# Patient Record
Sex: Male | Born: 1935 | Race: White | Hispanic: No | Marital: Married | Smoking: Former smoker
Health system: Southern US, Community
[De-identification: ages and names within clinical notes are randomized; demographics above are authoritative.]

## PROBLEM LIST (undated history)

## (undated) DIAGNOSIS — E7800 Pure hypercholesterolemia, unspecified: Secondary | ICD-10-CM

## (undated) DIAGNOSIS — G4733 Obstructive sleep apnea (adult) (pediatric): Secondary | ICD-10-CM

## (undated) DIAGNOSIS — N200 Calculus of kidney: Secondary | ICD-10-CM

## (undated) DIAGNOSIS — K219 Gastro-esophageal reflux disease without esophagitis: Secondary | ICD-10-CM

## (undated) DIAGNOSIS — C3490 Malignant neoplasm of unspecified part of unspecified bronchus or lung: Secondary | ICD-10-CM

## (undated) DIAGNOSIS — M1990 Unspecified osteoarthritis, unspecified site: Secondary | ICD-10-CM

## (undated) DIAGNOSIS — I10 Essential (primary) hypertension: Secondary | ICD-10-CM

## (undated) DIAGNOSIS — C439 Malignant melanoma of skin, unspecified: Secondary | ICD-10-CM

---
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
## 2001-04-13 ENCOUNTER — Encounter: Payer: Self-pay | Admitting: Pulmonary Disease

## 2001-04-13 ENCOUNTER — Ambulatory Visit (HOSPITAL_COMMUNITY): Admission: RE | Admit: 2001-04-13 | Discharge: 2001-04-13 | Payer: Self-pay | Admitting: Pulmonary Disease

## 2001-04-15 ENCOUNTER — Encounter (INDEPENDENT_AMBULATORY_CARE_PROVIDER_SITE_OTHER): Payer: Self-pay | Admitting: Specialist

## 2001-04-15 ENCOUNTER — Ambulatory Visit: Admission: RE | Admit: 2001-04-15 | Discharge: 2001-04-15 | Payer: Self-pay | Admitting: Pulmonary Disease

## 2001-05-14 ENCOUNTER — Encounter (INDEPENDENT_AMBULATORY_CARE_PROVIDER_SITE_OTHER): Payer: Self-pay | Admitting: *Deleted

## 2001-05-14 ENCOUNTER — Inpatient Hospital Stay (HOSPITAL_COMMUNITY): Admission: RE | Admit: 2001-05-14 | Discharge: 2001-05-19 | Payer: Self-pay | Admitting: Thoracic Surgery

## 2001-05-14 ENCOUNTER — Encounter: Payer: Self-pay | Admitting: Thoracic Surgery

## 2001-05-15 ENCOUNTER — Encounter: Payer: Self-pay | Admitting: Thoracic Surgery

## 2001-05-16 ENCOUNTER — Encounter: Payer: Self-pay | Admitting: Thoracic Surgery

## 2001-05-17 ENCOUNTER — Encounter: Payer: Self-pay | Admitting: Thoracic Surgery

## 2001-05-18 ENCOUNTER — Encounter: Payer: Self-pay | Admitting: Thoracic Surgery

## 2001-06-07 ENCOUNTER — Encounter: Payer: Self-pay | Admitting: Thoracic Surgery

## 2001-06-07 ENCOUNTER — Encounter: Admission: RE | Admit: 2001-06-07 | Discharge: 2001-06-07 | Payer: Self-pay | Admitting: Thoracic Surgery

## 2001-07-05 ENCOUNTER — Encounter: Admission: RE | Admit: 2001-07-05 | Discharge: 2001-07-05 | Payer: Self-pay | Admitting: Thoracic Surgery

## 2001-07-05 ENCOUNTER — Encounter: Payer: Self-pay | Admitting: Thoracic Surgery

## 2001-09-04 ENCOUNTER — Encounter: Payer: Self-pay | Admitting: Thoracic Surgery

## 2001-09-04 ENCOUNTER — Encounter: Admission: RE | Admit: 2001-09-04 | Discharge: 2001-09-04 | Payer: Self-pay | Admitting: Thoracic Surgery

## 2001-09-12 ENCOUNTER — Encounter: Payer: Self-pay | Admitting: Thoracic Surgery

## 2001-09-12 ENCOUNTER — Encounter: Admission: RE | Admit: 2001-09-12 | Discharge: 2001-09-12 | Payer: Self-pay | Admitting: Thoracic Surgery

## 2001-09-18 ENCOUNTER — Encounter (INDEPENDENT_AMBULATORY_CARE_PROVIDER_SITE_OTHER): Payer: Self-pay | Admitting: *Deleted

## 2001-09-18 ENCOUNTER — Encounter: Payer: Self-pay | Admitting: Thoracic Surgery

## 2001-09-18 ENCOUNTER — Ambulatory Visit (HOSPITAL_COMMUNITY): Admission: RE | Admit: 2001-09-18 | Discharge: 2001-09-18 | Payer: Self-pay | Admitting: Thoracic Surgery

## 2001-10-09 ENCOUNTER — Encounter: Admission: RE | Admit: 2001-10-09 | Discharge: 2001-10-09 | Payer: Self-pay | Admitting: Thoracic Surgery

## 2001-10-09 ENCOUNTER — Encounter: Payer: Self-pay | Admitting: Thoracic Surgery

## 2001-10-21 ENCOUNTER — Encounter (INDEPENDENT_AMBULATORY_CARE_PROVIDER_SITE_OTHER): Payer: Self-pay | Admitting: *Deleted

## 2001-10-21 ENCOUNTER — Encounter: Payer: Self-pay | Admitting: Thoracic Surgery

## 2001-10-21 ENCOUNTER — Ambulatory Visit (HOSPITAL_COMMUNITY): Admission: RE | Admit: 2001-10-21 | Discharge: 2001-10-22 | Payer: Self-pay | Admitting: Thoracic Surgery

## 2002-01-21 ENCOUNTER — Encounter: Admission: RE | Admit: 2002-01-21 | Discharge: 2002-01-21 | Payer: Self-pay | Admitting: Thoracic Surgery

## 2002-01-21 ENCOUNTER — Encounter: Payer: Self-pay | Admitting: Thoracic Surgery

## 2002-04-30 ENCOUNTER — Encounter: Payer: Self-pay | Admitting: Thoracic Surgery

## 2002-04-30 ENCOUNTER — Encounter: Admission: RE | Admit: 2002-04-30 | Discharge: 2002-04-30 | Payer: Self-pay | Admitting: Thoracic Surgery

## 2002-08-29 ENCOUNTER — Encounter: Admission: RE | Admit: 2002-08-29 | Discharge: 2002-08-29 | Payer: Self-pay | Admitting: Thoracic Surgery

## 2002-08-29 ENCOUNTER — Encounter: Payer: Self-pay | Admitting: Thoracic Surgery

## 2002-09-04 ENCOUNTER — Encounter: Admission: RE | Admit: 2002-09-04 | Discharge: 2002-09-04 | Payer: Self-pay | Admitting: Thoracic Surgery

## 2002-09-04 ENCOUNTER — Encounter: Payer: Self-pay | Admitting: Thoracic Surgery

## 2002-09-09 ENCOUNTER — Encounter (INDEPENDENT_AMBULATORY_CARE_PROVIDER_SITE_OTHER): Payer: Self-pay | Admitting: *Deleted

## 2002-09-10 ENCOUNTER — Ambulatory Visit (HOSPITAL_COMMUNITY): Admission: RE | Admit: 2002-09-10 | Discharge: 2002-09-10 | Payer: Self-pay | Admitting: Thoracic Surgery

## 2002-09-10 ENCOUNTER — Encounter (INDEPENDENT_AMBULATORY_CARE_PROVIDER_SITE_OTHER): Payer: Self-pay | Admitting: *Deleted

## 2002-09-10 ENCOUNTER — Encounter: Payer: Self-pay | Admitting: Thoracic Surgery

## 2002-11-27 ENCOUNTER — Encounter: Admission: RE | Admit: 2002-11-27 | Discharge: 2002-11-27 | Payer: Self-pay | Admitting: Thoracic Surgery

## 2002-11-27 ENCOUNTER — Encounter: Payer: Self-pay | Admitting: Thoracic Surgery

## 2002-12-17 ENCOUNTER — Ambulatory Visit (HOSPITAL_COMMUNITY): Admission: RE | Admit: 2002-12-17 | Discharge: 2002-12-17 | Payer: Self-pay | Admitting: Thoracic Surgery

## 2002-12-17 ENCOUNTER — Encounter: Payer: Self-pay | Admitting: Thoracic Surgery

## 2002-12-31 ENCOUNTER — Encounter (INDEPENDENT_AMBULATORY_CARE_PROVIDER_SITE_OTHER): Payer: Self-pay | Admitting: Specialist

## 2002-12-31 ENCOUNTER — Inpatient Hospital Stay (HOSPITAL_COMMUNITY): Admission: RE | Admit: 2002-12-31 | Discharge: 2003-01-04 | Payer: Self-pay | Admitting: Thoracic Surgery

## 2002-12-31 ENCOUNTER — Encounter: Payer: Self-pay | Admitting: Thoracic Surgery

## 2003-01-01 ENCOUNTER — Encounter: Payer: Self-pay | Admitting: Thoracic Surgery

## 2003-01-02 ENCOUNTER — Encounter: Payer: Self-pay | Admitting: Thoracic Surgery

## 2003-01-03 ENCOUNTER — Encounter: Payer: Self-pay | Admitting: Thoracic Surgery

## 2003-01-13 ENCOUNTER — Encounter: Payer: Self-pay | Admitting: Thoracic Surgery

## 2003-01-13 ENCOUNTER — Encounter: Admission: RE | Admit: 2003-01-13 | Discharge: 2003-01-13 | Payer: Self-pay | Admitting: Thoracic Surgery

## 2003-02-26 ENCOUNTER — Encounter: Payer: Self-pay | Admitting: Thoracic Surgery

## 2003-02-26 ENCOUNTER — Encounter: Admission: RE | Admit: 2003-02-26 | Discharge: 2003-02-26 | Payer: Self-pay | Admitting: Thoracic Surgery

## 2003-04-28 ENCOUNTER — Encounter: Admission: RE | Admit: 2003-04-28 | Discharge: 2003-04-28 | Payer: Self-pay | Admitting: Thoracic Surgery

## 2003-04-28 ENCOUNTER — Encounter: Payer: Self-pay | Admitting: Thoracic Surgery

## 2003-07-28 ENCOUNTER — Encounter: Admission: RE | Admit: 2003-07-28 | Discharge: 2003-07-28 | Payer: Self-pay | Admitting: Thoracic Surgery

## 2003-10-27 ENCOUNTER — Encounter: Admission: RE | Admit: 2003-10-27 | Discharge: 2003-10-27 | Payer: Self-pay | Admitting: Thoracic Surgery

## 2004-04-28 ENCOUNTER — Encounter: Admission: RE | Admit: 2004-04-28 | Discharge: 2004-04-28 | Payer: Self-pay | Admitting: Thoracic Surgery

## 2004-11-02 ENCOUNTER — Encounter: Admission: RE | Admit: 2004-11-02 | Discharge: 2004-11-02 | Payer: Self-pay | Admitting: Thoracic Surgery

## 2005-05-10 ENCOUNTER — Encounter: Admission: RE | Admit: 2005-05-10 | Discharge: 2005-05-10 | Payer: Self-pay | Admitting: Thoracic Surgery

## 2005-11-08 ENCOUNTER — Encounter: Admission: RE | Admit: 2005-11-08 | Discharge: 2005-11-08 | Payer: Self-pay | Admitting: Thoracic Surgery

## 2006-03-12 IMAGING — CT CT CHEST W/O CM
1 of 2 series · 14 of 30 positions shown, 18 images · non-contrast
Comparison: none

CLINICAL DATA: Follow up chest wall mass.  Surgery for right lung cancer, 05/14/01, with right benign lesion removed, [DATE].
CHEST CT, NO CONTRAST ? 04/28/04

[Series 2: — · axial · 0.80mm/px · z∈[-308,-8]mm · 14 of 71 slices shown, 18 images]
[im 6/71  mediastinal]
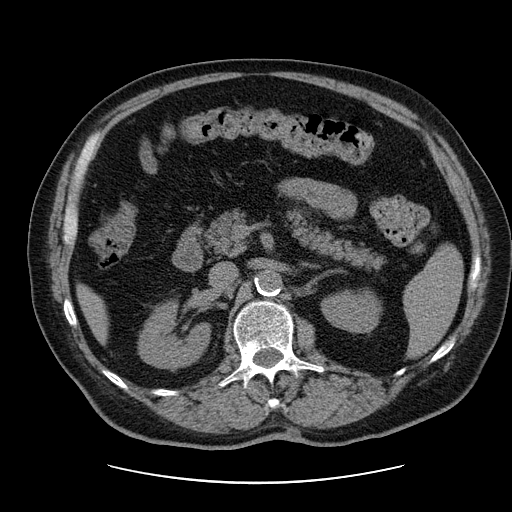
[im 6/71  lung]
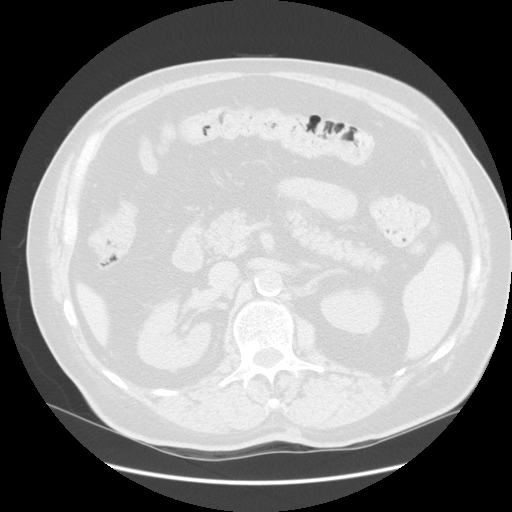
[im 11/71  lung]
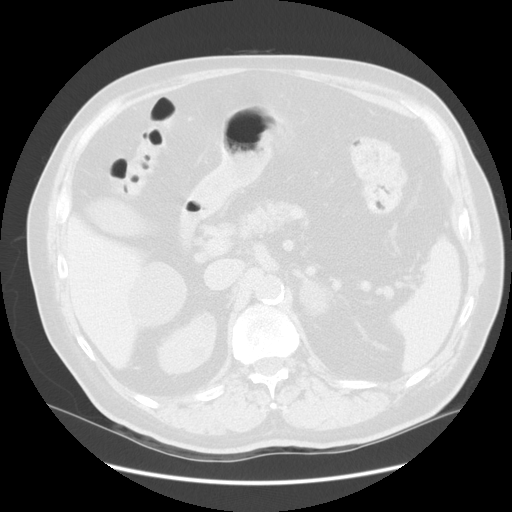
[im 16/71  lung]
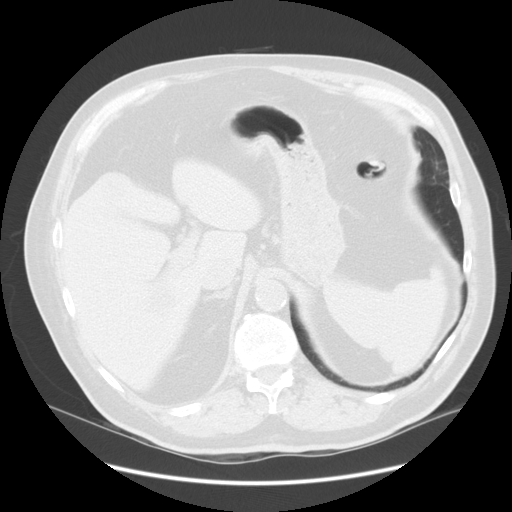
[im 21/71  lung]
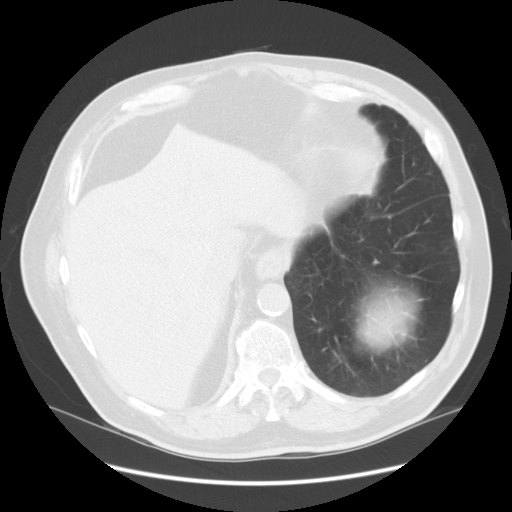
[im 26/71  mediastinal]
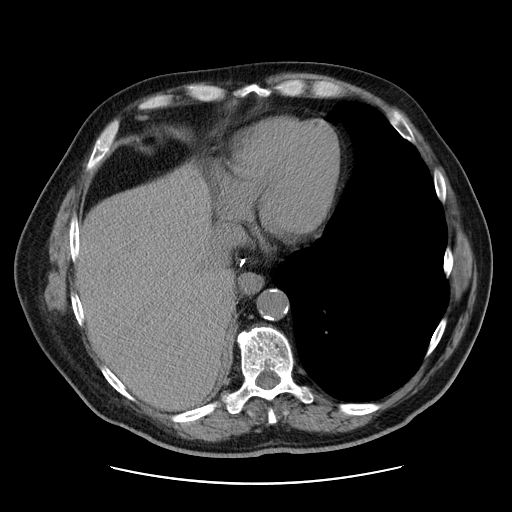
[im 26/71  lung]
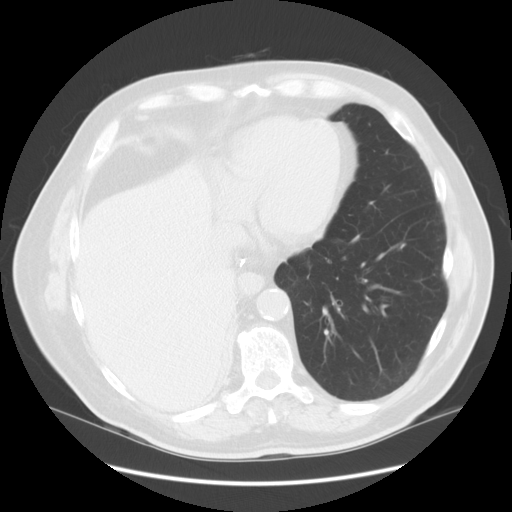
[im 31/71  lung]
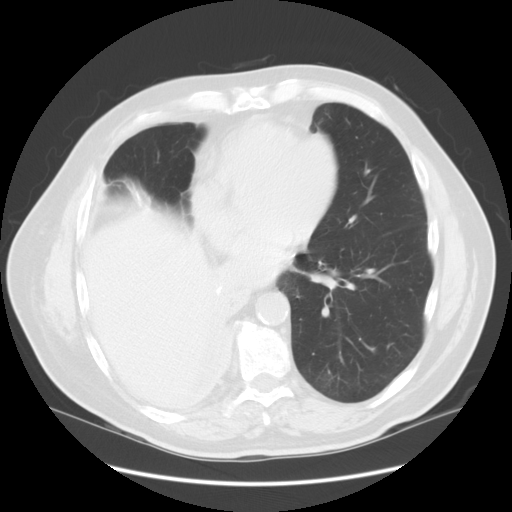
[im 35/71  lung]
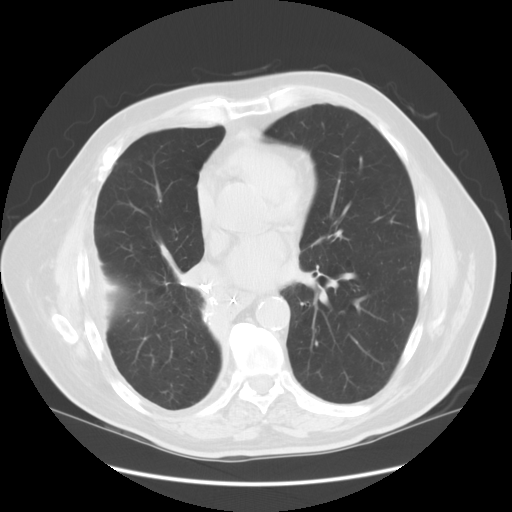
[im 36/71  lung]
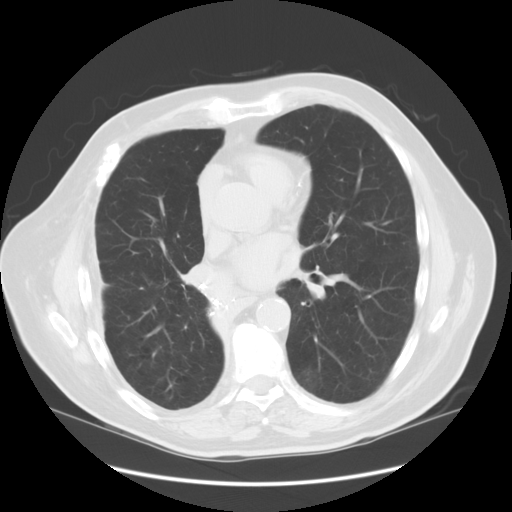
[im 41/71  mediastinal]
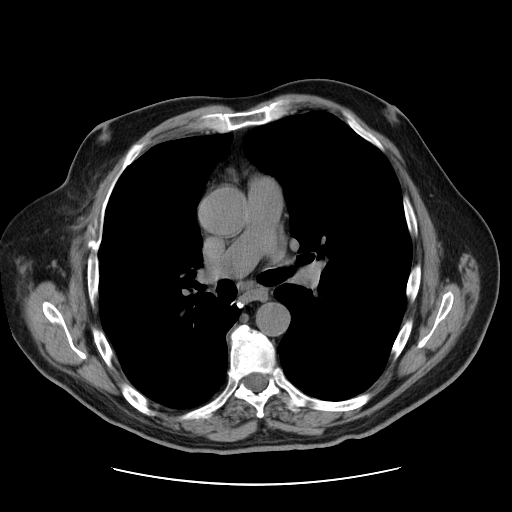
[im 41/71  lung]
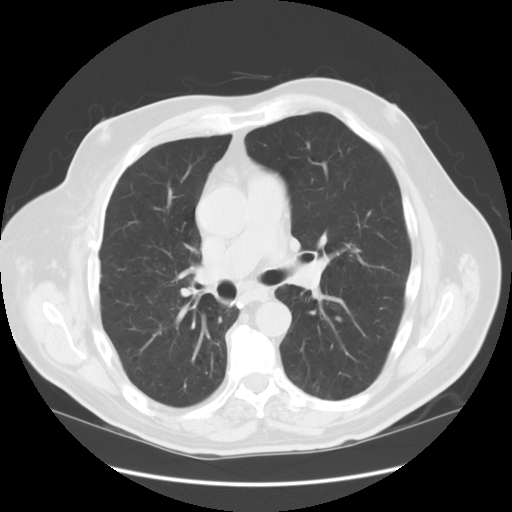
[im 46/71  lung]
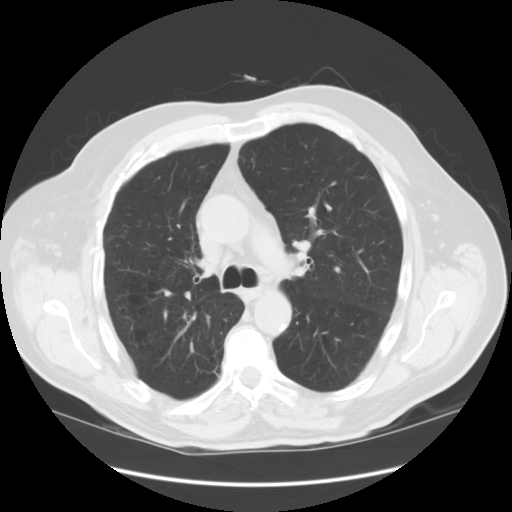
[im 51/71  lung]
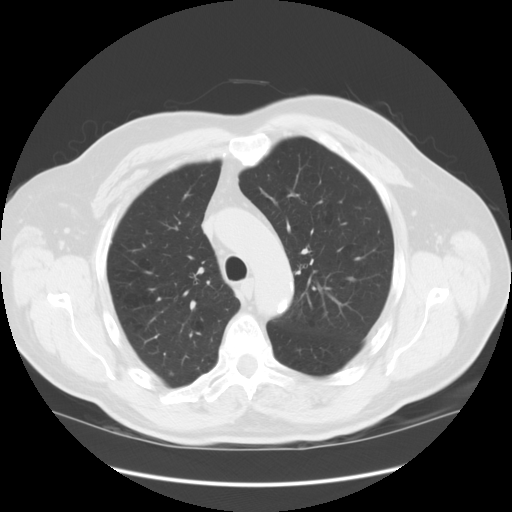
[im 56/71  lung]
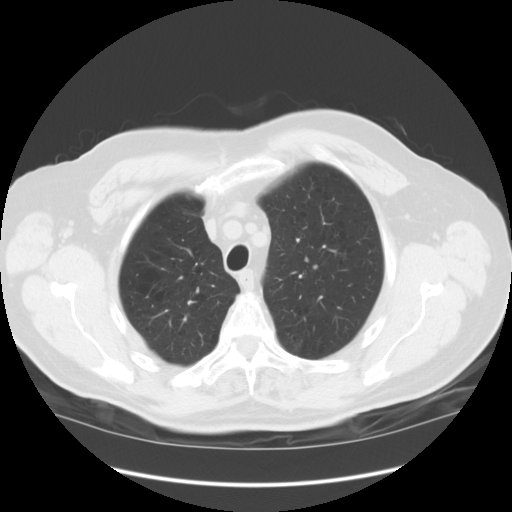
[im 61/71  mediastinal]
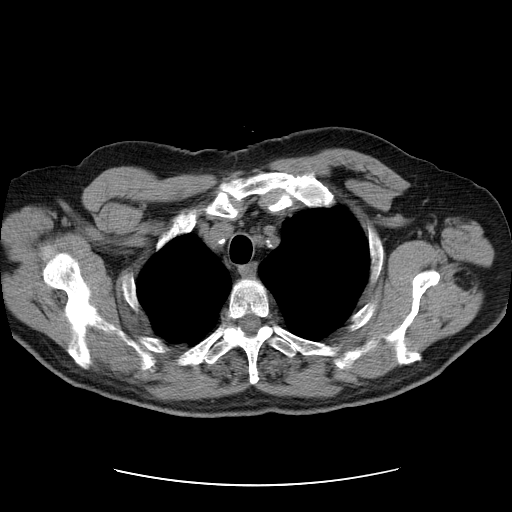
[im 61/71  lung]
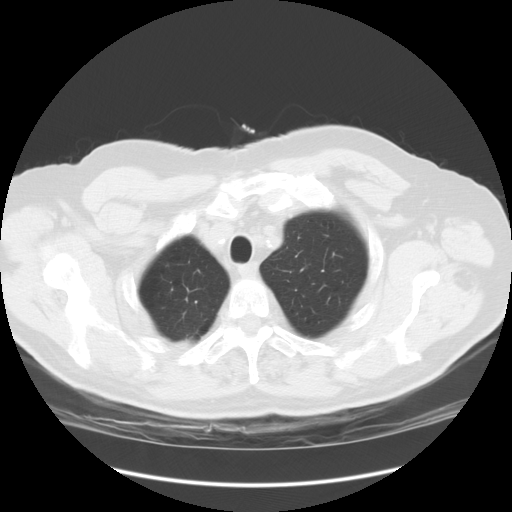
[im 66/71  lung]
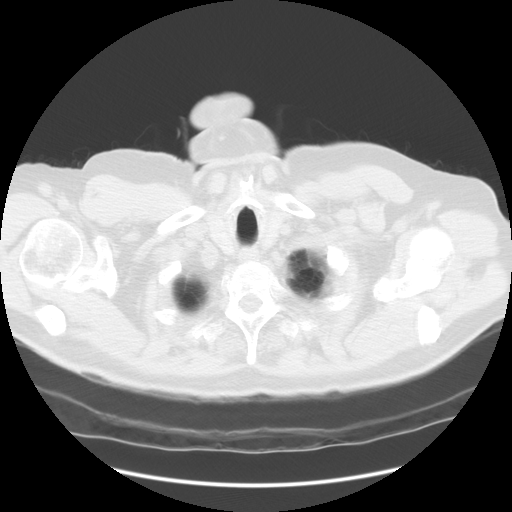

[14 of 30 positions shown; findings below may reference images not displayed]

FINDINGS: Comparison is made with 07/28/03.  Multidetector spiral axial images were obtained through the thorax with no IV contrast.  Since prior study, stable surgical resection is seen at the right anterolateral thorax with no evidence for locally recurrent nor metastatic disease.  Stable 4 mm benign appearing calcification is seen at the left posterolateral pleura (image 42) with slight elevation of the right hemidiaphragm and surgical clips at the right lower lobe, as noted previously.  Stable COPD with lungs otherwise clear with no new mediastinal, hilar, nor axillary mass/adenopathy are noted.  No new osseous metastatic disease is seen.  Heart size is stable with atheromatous vascular calcification, including coronary arteries.  Upper abdominal organs are stable with 7 cm inferior partially exophytic right hepatic cyst and findings consistent with left adrenal adenoma.  Previous left renal calculus is currently not included for assessment.
IMPRESSION
Since 07/28/03, no interval change:
1.  No evidence for locally recurrent nor metastatic lung cancer.
2.  Stable COPD.
3.  Stable right hepatic cyst and probable left adrenal adenoma.

## 2006-06-13 ENCOUNTER — Encounter: Admission: RE | Admit: 2006-06-13 | Discharge: 2006-06-13 | Payer: Self-pay | Admitting: Thoracic Surgery

## 2007-01-15 ENCOUNTER — Ambulatory Visit: Payer: Self-pay | Admitting: Thoracic Surgery

## 2007-01-15 ENCOUNTER — Encounter: Admission: RE | Admit: 2007-01-15 | Discharge: 2007-01-15 | Payer: Self-pay | Admitting: Thoracic Surgery

## 2007-03-24 IMAGING — CT CT CHEST W/O CM
1 series · 15 of 31 positions shown, 19 images · IV contrast (agent unspecified)
Comparison: This scan is compared to a prior CT of the chest of 04/28/04.

CLINICAL DATA: Followup lung carcinoma.  History of right lung carcinoma diagnosis in 2552 with right lung resection. 
 CHEST CT WITHOUT CONTRAST:
TECHNIQUE: Multidetector CT imaging of the chest was performed following the standard protocol without IV contrast.

[Series 2: chest w/o · axial · non-contrast · 0.70mm/px · z∈[-419,-89]mm · 15 of 72 slices shown, 19 images]
[im 3/72  mediastinal]
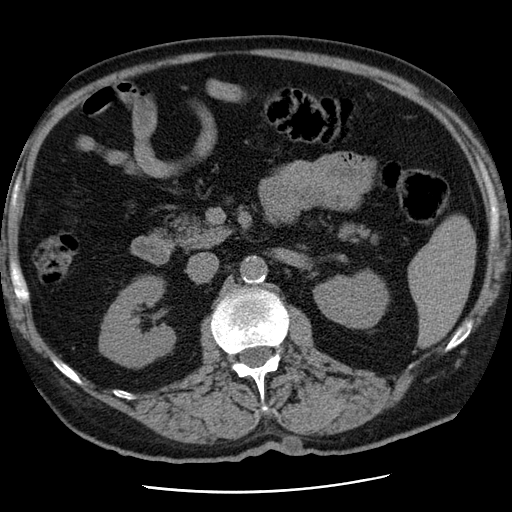
[im 3/72  lung]
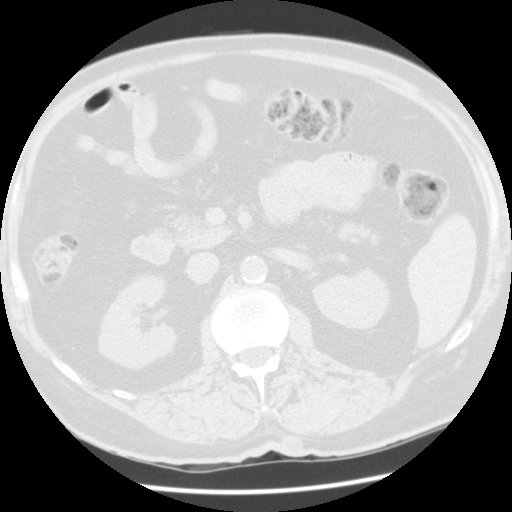
[im 8/72  lung]
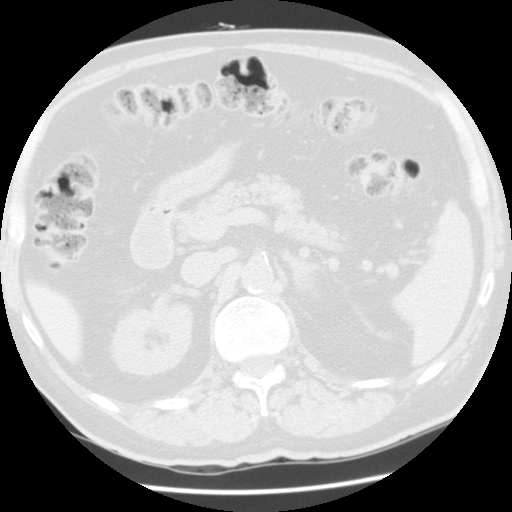
[im 14/72  lung]
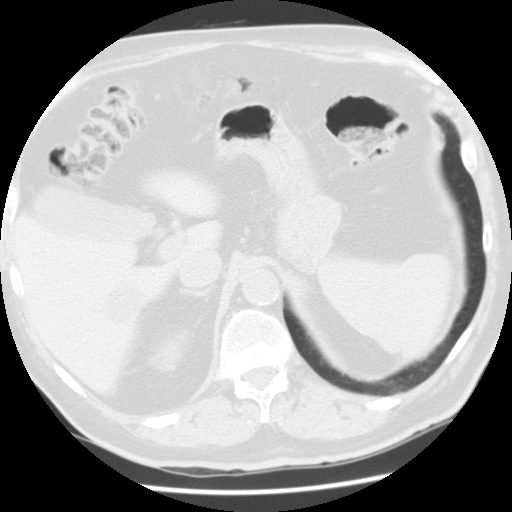
[im 16/72  lung]
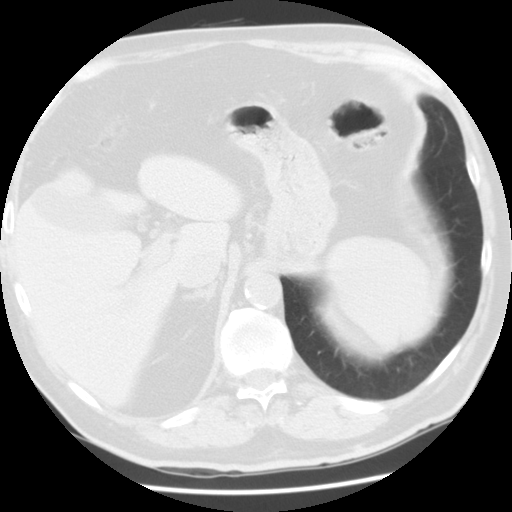
[im 22/72  mediastinal]
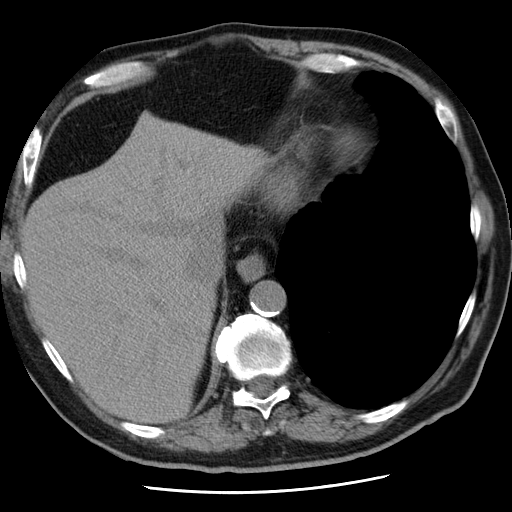
[im 22/72  lung]
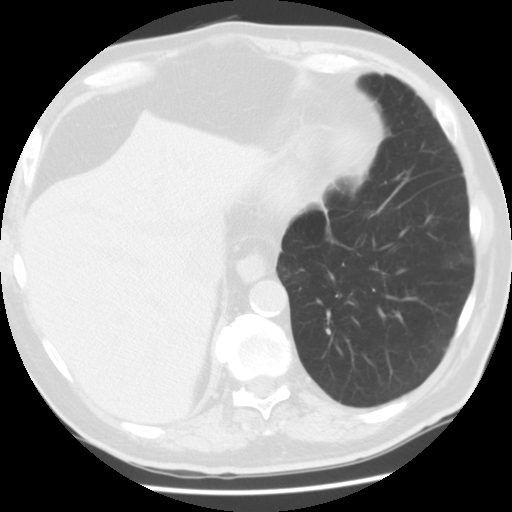
[im 27/72  lung]
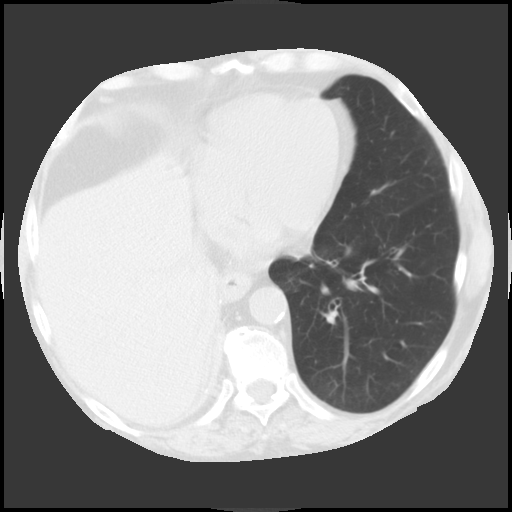
[im 32/72  lung]
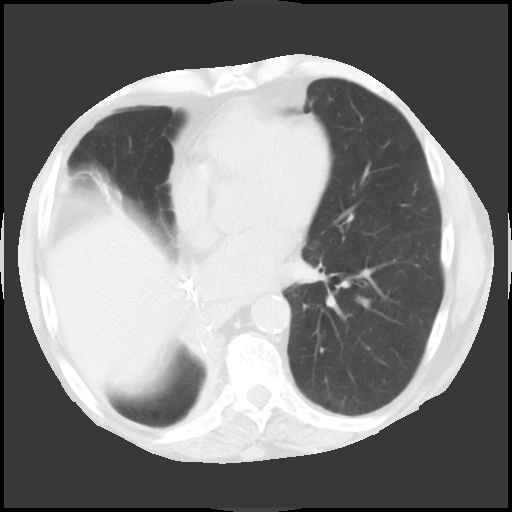
[im 37/72  lung]
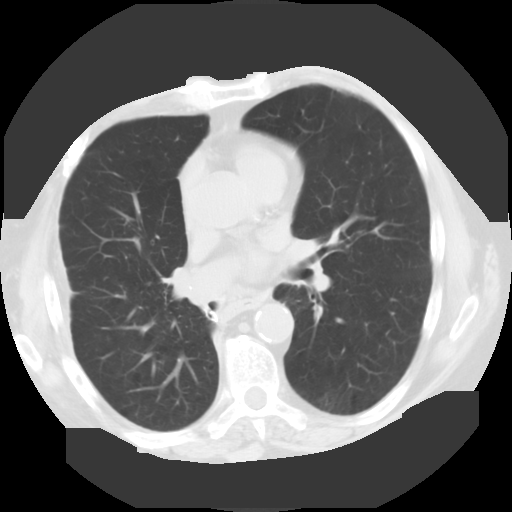
[im 40/72  mediastinal]
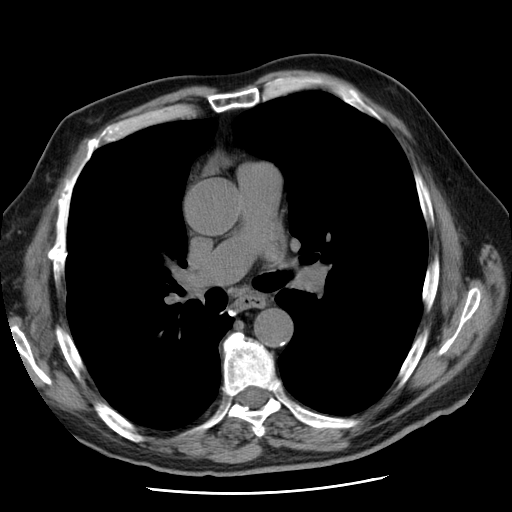
[im 40/72  lung]
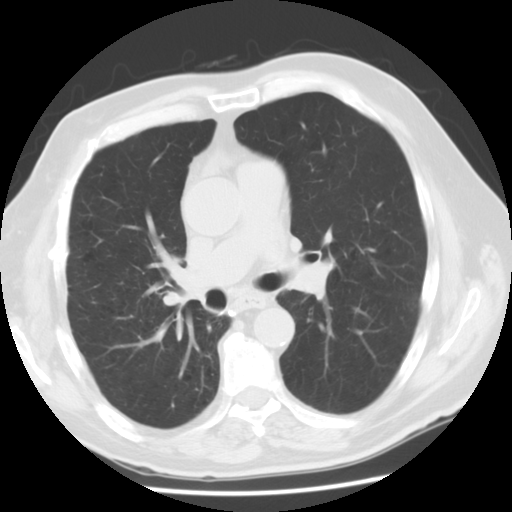
[im 45/72  lung]
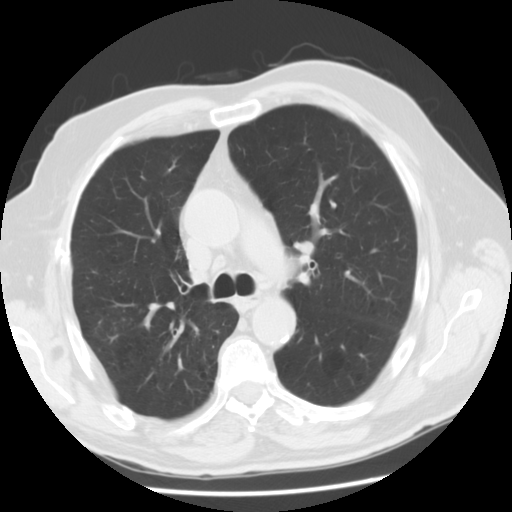
[im 50/72  lung]
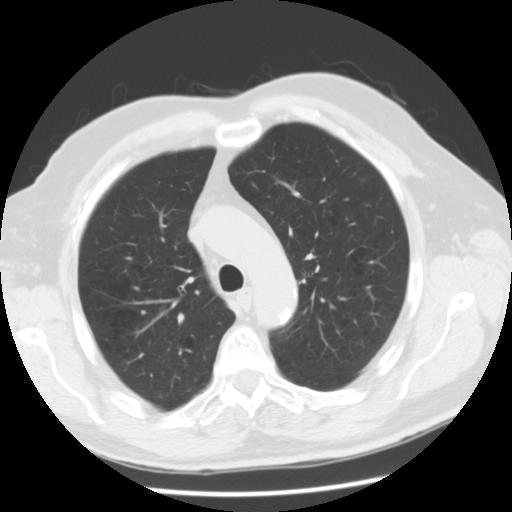
[im 56/72  lung]
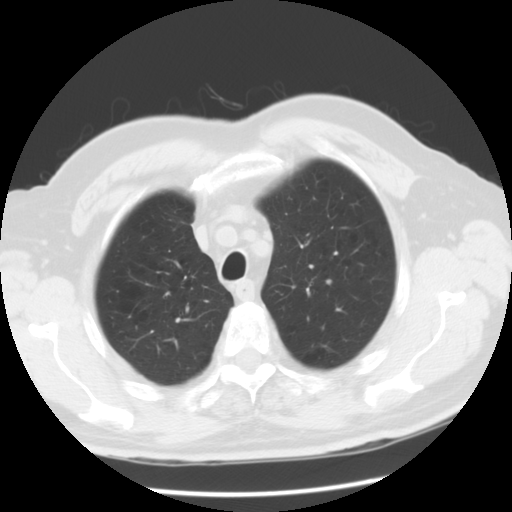
[im 58/72  mediastinal]
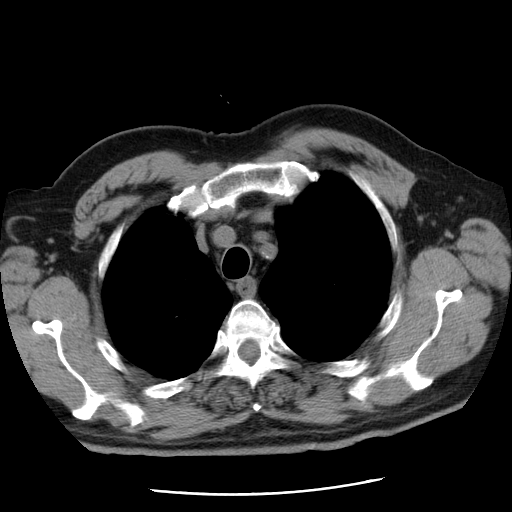
[im 58/72  lung]
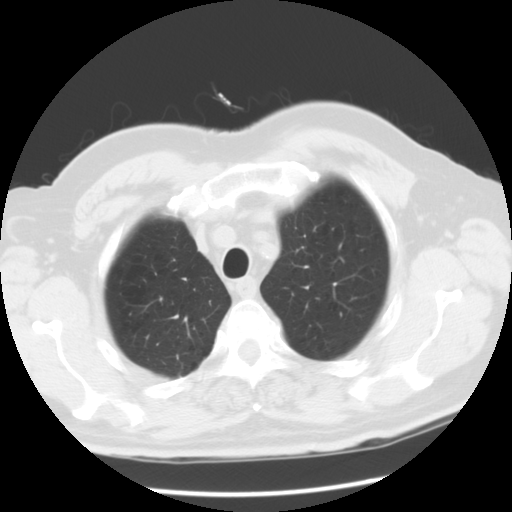
[im 64/72  lung]
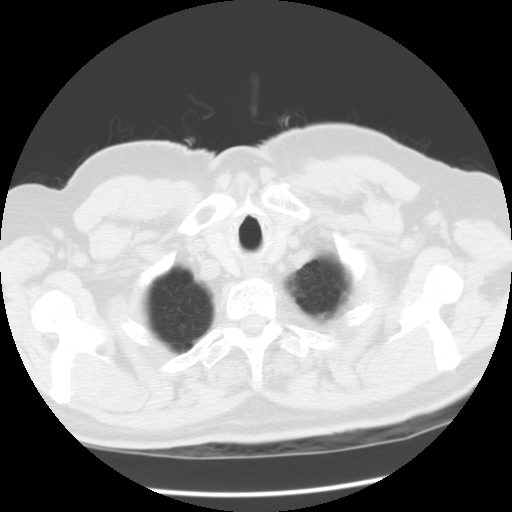
[im 69/72  lung]
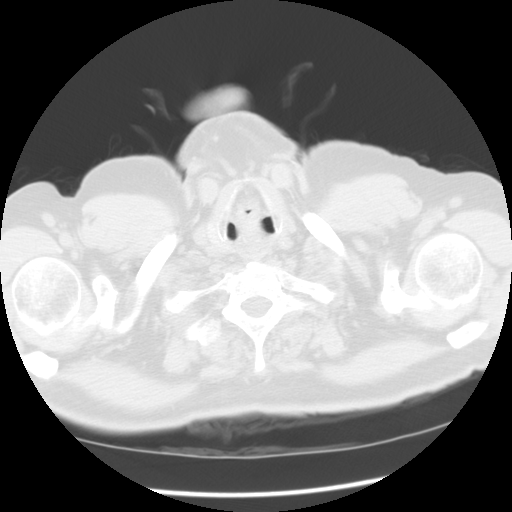

[15 of 31 positions shown; findings below may reference images not displayed]

FINDINGS: Bolus changes are noted throughout the lungs diffusely.  Pleural thickening remains with some linear scarring at the right lung base postoperatively.  Surgical clips overlie the right infrahilar region.  No lung parenchymal nodule is seen and no effusion is noted.  Small calcified pleural plaque in the left posterior lung base is stable.  Scarring at the posteromedial right lung base appears stable.  No evidence of recurrence of lung carcinoma is seen.  Right posterior hepatic cyst appears slightly smaller.  Low attenuation and enlargement of the left adrenal gland is stable and most likely represents an incidental adrenal adenoma.
IMPRESSION: 1.  Stable postop changes on the right.  No evidence of recurrence of lung carcinoma.  
 2.  COPD. 
 3.  Stable left adrenal nodule consistent with adenoma.

## 2007-07-10 ENCOUNTER — Encounter: Admission: RE | Admit: 2007-07-10 | Discharge: 2007-07-10 | Payer: Self-pay | Admitting: Thoracic Surgery

## 2007-07-10 ENCOUNTER — Ambulatory Visit: Payer: Self-pay | Admitting: Thoracic Surgery

## 2008-07-08 ENCOUNTER — Encounter: Admission: RE | Admit: 2008-07-08 | Discharge: 2008-07-08 | Payer: Self-pay | Admitting: Thoracic Surgery

## 2008-07-08 ENCOUNTER — Ambulatory Visit: Payer: Self-pay | Admitting: Thoracic Surgery

## 2009-07-06 ENCOUNTER — Ambulatory Visit: Payer: Self-pay | Admitting: Thoracic Surgery

## 2009-07-06 ENCOUNTER — Encounter: Admission: RE | Admit: 2009-07-06 | Discharge: 2009-07-06 | Payer: Self-pay | Admitting: Thoracic Surgery

## 2011-01-03 NOTE — Assessment & Plan Note (Signed)
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  A CT scan showed no  evidence of recurrence.  He is doing well overall.  I will see him back  again in 1 year with another CT scan and that will probably be the last  time we see him.   Ines Bloomer, M.D.  Electronically Signed   DPB/MEDQ  D:  07/08/2008  T:  07/08/2008  Job:  [id number]   cc:   Marcille Buffy, M.D.

## 2011-01-03 NOTE — Assessment & Plan Note (Signed)
OFFICE VISIT   [name], [name]  DOB:  06/07/1931                                        Jan 15, 2007  CHART #:  [id number]   Patient came for followup today, and we reviewed his CT scan.  I do not  have a final report, but the preliminary report, that this is no  evidence for recurrence of his cancer or his desmoid tumor.   His blood pressure was 138/68, pulse 58, respirations 18, sats were 97%.  Lungs were clear to auscultation and percussion.   He is doing well overall, has had no major medical problems, although he  does say he is a little more tired than usual.  Plan to see him back  again in six months with a chest x-ray.   Ines Bloomer, M.D.  Electronically Signed   DPB/MEDQ  D:  01/15/2007  T:  01/15/2007  Job:  [id number]

## 2011-01-03 NOTE — Letter (Signed)
July 06, 2009   [name]  [address]  [place], [place] [address]   Re:  [name], [name]               DOB:  1930-08-27   Dear Dr. Ardelle Park:   I saw the patient back today for followup.  It has been 8 years since we  resected his lung cancer and 6 years since we resected his desmoid  tumor.  His CT scan today showed no evidence for recurrence of either  the desmoid tumor or the cancer.  He is doing well overall, and I will  refer him back to you for long-term follow up.  I will be happy to see  him again should he develop any further pulmonary problems.   Ines Bloomer, M.D.  Electronically Signed   DPB/MEDQ  D:  07/06/2009  T:  07/07/2009  Job:  [id number]

## 2011-01-06 NOTE — H&P (Signed)
NAME:  [name], [name] NO.:  [id number]   MEDICAL RECORD NO.:  [id number]                   PATIENT TYPE:  INP   LOCATION:                                       FACILITY:  [facility]   PHYSICIAN:  Ines Bloomer, M.D.              DATE OF BIRTH:  09-20-1930   DATE OF ADMISSION:  12/31/2002  DATE OF DISCHARGE:                                HISTORY & PHYSICAL   CHIEF COMPLAINT:  Chest wall mass.   HISTORY OF PRESENT ILLNESS:  The patient is a 77 year old white male who is  status post previous right middle and lower lobectomies for a poorly  differentiated adenocarcinoma performed by Dr. Edwyna Shell in 9/02.  He returned  recently for routine followup with Dr. Edwyna Shell and a chest x-ray showed a new  right pleural based 3 cm mass.  In January a needle biopsy was performed  which was positive only for benign scar tissue.  He returned for followup  again in 4/04 and a repeat CT scan at that time showed that the mass had  increased in size now to  2.6 x 5.3 cm.  Dr. Edwyna Shell sent him for a  PET scan which showed intermediate  activity in the area of this mass.  This area is suspicious for malignancy.  Therefore, it is recommended that he proceed with surgical excision at this  time.  He does report a dry nonproductive cough which occurs mostly in the  morning.  He does also have some shortness of breath or dyspnea on exertion.  He has not had fevers, chills, weight loss or hemoptysis.   PAST MEDICAL HISTORY:  1. 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 Hypertension.  3. Osteoarthritis.  4. Hypercholesterolemia.  5. History of mitral regurgitation.  6. History of nephrolithiasis.  7. Gastroesophageal reflux disease.  8. Chronic dizziness.   PAST SURGICAL HISTORY:  1. Right middle and lower lobectomies in 9/02 by Dr. Edwyna Shell.  2. Followup bronchoscopy in 1/03 and 3/03.  3. Right inguinal  hernia repair, 2/02.  4. Status post multiple lithotripsies.  5.  Status post surgical excision of kidney stones.   CURRENT MEDICATIONS:  1. Diovan 80/12.5 daily.  2. Zantac 75 mg q.h.s.  3. Enteric coated aspirin 81 mg daily, last taken one week ago.  4. Centrum Silver one daily.  5. Glucosamine 1000 mg daily.  6. Vitamin E 400 International Units daily.  7. Dramamine one daily or p.r.n.  8. Benadryl one daily.   ALLERGIES:  IV DYE which caused a rash.   FAMILY HISTORY:  His father died at age 61 of colon cancer.  He also had a  history of coronary artery disease.  He had one sister who died of breast  cancer.  He has one brother who is alive and well with asthma and diabetes  mellitus.   SOCIAL HISTORY:  He is  married and has three children.  He is retired.  He  smoked 1 1/2 packs of cigarettes per day for 15 years.  He quit about 30  years ago.  He denies alcohol use.   REVIEW OF SYSTEMS:  See history of present illness for pertinent positives  and negatives.  Also, he has chronic dizziness which has never been worked  up although he takes Dramamine over-the-counter fairly regularly for this  problem. He states that his right ear feels blocked and has been worse for  about the past six months after an ear infection.  He has had problems with  chest pain the past month and has undergone a complete cardiac workup at  [facility] which was negative.  He does wear reading glasses.  He  also reports shin splints fairly frequently.   PHYSICAL EXAMINATION:  VITAL SIGNS:  Blood pressure 158/84, pulse 80 and  regular, respirations 16 and unlabored.  GENERAL:  This is a well-developed, well-nourished white male in no acute  distress.  HEENT:  Normocephalic, atraumatic.  Pupils equal, round and reactive to  light and accommodation.  Extraocular movements intact.  The left ear canal  is occluded with cerumen. The right TM is visualized and reveals no  abnormalities.  Nares patent bilaterally.  Oropharynx is clear with moist  mucous membranes.   Teeth are in good repair. He has 3+ tonsillar hypertrophy  with no acute infection.  NECK:  Supple without lymphadenopathy, thyromegaly or carotid bruits.  HEART:  Regular  rate and rhythm without murmurs, rubs or gallops.  LUNGS:  Clear to auscultation.  He has well healed right thoracotomy scar.  ABDOMEN:  Soft, nontender, nondistended with active bowel sounds in all  quadrants.  He does have lower abdominal scar from his previous kidney stone  surgery.  EXTREMITIES:  No clubbing, cyanosis or edema.  Femoral, dorsalis pedis and  posterior tibial pulses are 2+ and symmetrical bilaterally.  NEURO:  Cranial nerves II-XII grossly intact.  He is alert and oriented x3.  His gait is within normal limits.   ASSESSMENT/PLAN:  This is a 77 year old white male with a history of right  lower lobe lobectomy and right middle and lower lobectomy for adenocarcinoma  who presents at this time with a new right pleural based mass.  He will be  admitted to [facility] on 12/31/02 and undergo right chest wall  resection by Dr. Edwyna Shell.  He is to have a CT scan prior to his surgery to  localize this mass.     Coral Ceo, P.A.                        Ines Bloomer, M.D.    GC/MEDQ  D:  12/29/2002  T:  12/30/2002  Job:  [id number]   cc:   Harl Bowie, M.D.  [address]  [place]  [place] [address]  Fax: [phone]

## 2011-01-06 NOTE — Op Note (Signed)
NAME:  [name], [name] NO.:  [id number]   MEDICAL RECORD NO.:  [id number]                   PATIENT TYPE:  INP   LOCATION:  3309                                 FACILITY:  [facility]   PHYSICIAN:  Ines Bloomer, M.D.              DATE OF BIRTH:  01-30-1931   DATE OF PROCEDURE:  12/31/2002  DATE OF DISCHARGE:                                 OPERATIVE REPORT   PREOPERATIVE DIAGNOSIS:  Right anterior enlarging chest wall mass.   POSTOPERATIVE DIAGNOSIS:  Right anterior enlarging chest wall mass.   OPERATION PERFORMED:  Resection of anterior chest wall mas with  reconstruction.   SURGEON:  Ines Bloomer, M.D.   ASSISTANT:  Lissa Merlin, P.A.   ANESTHESIA:  General.   DESCRIPTION OF PROCEDURE:  After percutaneous insertion of all monitoring  lines, the patient underwent general anesthesia and was turned to the right  lateral thoracotomy position.  The previous chest wall mass had been marked  in x-ray to find the area.  This patient had had a previous right middle  lobe and right lower lobectomy for nonsmall cell lung cancer and then was  having an enlarging lesion between the fourth and the fifth ribs at the  anterior axillary line.  This increased slowly up to 5 x 3 cm in size.  It  had been previously biopsied and it was negative.  A PET scan was done which  was intermediate as far as uptake.  For this reason it was decided to do a  chest wall resection of this.  The patient understands the procedure and  agreed to the procedure.  A 7 cm incision was made over the incision and  carried below the mammary line.  The subcutaneous tissue was divided with  electrocautery.  The lateral part of the pectoralis and the medial portion  of the serratus was divided, exposing the fourth intercostal space.  This  was entered and the lesion could be palpated between the fourth and the  fifth ribs.  The fourth rib interspace was opened and the lung which was  stuck to the chest wall was dissected off.  It was dissected approximately 4  cm from medial to lateral.  Then the fourth interspace was divided and the  fifth rib was dissected out proximally and then attention was turned to the  fifth rib inferiorly and this was also dissected out. The fifth interspace  was then dissected up but it was really stuck to the lung and had to be  dissected off with sharp dissection.  After the fifth interspace had been  divided, the ribs were divided with the guillotine rib shear and the mass  was dissected off the lung with sharp dissection.  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  The margins appeared to be grossly  adequate.  The chest tube was brought into a separate stab wound  into the  chest  and tied in place with 0 silk.  A 2 mm Gore-Tex patch was then  sutured to the defect using interrupted #1 Prolene alternating with 0  Prolene sutures.  These sutures were placed around the ribs superiorly and  inferiorly and then also to the soft tissue until the defect was completely  obliterated.  There was approximately a 10 x 10 cm defect.  After this had  been done, a Jackson-Pratt drain was brought in over the patch and sutured  in place with 0 silk and then two  Marcaine catheters for the On-Q were medially and laterally and placed on  the intercostal spaces.  Then the muscle was reapproximated with #1 Vicryl,  subcutaneous tissue with 2-0 Vicryl and the skin with 3-0 Vicryl.  Dermabond  was applied to the skin.  The patient was returned to recovery in stable  condition.                                                Ines Bloomer, M.D.    DPB/MEDQ  D:  12/31/2002  T:  01/01/2003  Job:  [id number]   cc:   Dellia Beckwith, M.D.  [address]  [place]  [place] [address]  Fax: [phone]   Harl Bowie, M.D.  [address]  [place]  [place] [address]  Fax: [phone]

## 2011-01-06 NOTE — Discharge Summary (Signed)
[place]  Patient:    [name], [name] Visit Number: [id number] MRN: [id number]          Service Type: SUR Location: 2000 2028 01 Attending Physician:  Cameron Proud Dictated by:   Sherrie George, P.A. Admit Date:  05/14/2001 Disc. Date: 05/19/01   CC:         Norton Blizzard, M.D.  Marnee Guarneri, M.D.  of Hematology/Oncology Service  Oley Balm. Sung Amabile, M.D. [facility]  Helene Kelp, M.D.   Discharge Summary  DATE OF BIRTH:  November 09, 1930. (77 years old)  ADMITTING DIAGNOSES: 1. A 6.3 by 7.1 cm right lower lobe mass, rule out carcinoma. 2. Hypertension. 3. History of tobacco use. 4. Osteoarthritis.  DISCHARGE DIAGNOSES: 1. Poorly differentiated adenocarcinoma right lower lobe with extension to but    not through the distal pleura, stage Ib, T2, N0, MX. 2. History of hypertension. 3. History of tobacco abuse. 4. Osteoarthritis.  BRIEF HISTORY:  The patient is a 77 year old white male, medical patient of Dr. Thereasa Solo Dhatt in [place], Ashley Washington and Dr. Oley Balm. Simonds, who was found to have an abnormal chest x-ray.  It was unclear as to follow up but the patient now returns with complaints of persistently aching joints.  He was referred to Dr. Lacretia Nicks. Chase Picket and was started on prednisone with no improvement.  Chest x-ray was again obtained.  This showed a right lower lobe mass.  A follow-up CT confirmed a 6.3 by 7.1 cm mass as well as two small less than 1 cm nodules immediately lateral to the mass.  He was seen by Dr. Onalee Hua B. Simonds, of the pulmonary service, and underwent bronchoscopy.  Brushings showed atypia but nothing was diagnostic of a malignancy.  He was referred to Dr. Algis Downs. Karle Plumber, who after reviewing the studies, agreed the patient should undergo right middle and possible right lower lobe lobectomy for removal of his mass.  He was subsequently admitted for the same at this time.  PAST MEDICAL HISTORY:   There was a questionable history of a mitral regurgitation, nephrolithiasis, hypertension, osteoarthritis, hypercholesterolemia, anxiety, and a 45-pack-year history of smoking.  He quit smoking in 1974.  PAST SURGICAL HISTORY:  Inguinal hernia repair, September 28, 2000.  He had lithotripsies in 1962 and 1981.  MEDICATIONS: 1. Spironolactone/HCTZ 25/25 with 1 q.d. 2. Multivitamin 1 q.d. 3. Vitamin E 1 q.d. 4. Glucosamine 1000 mg b.i.d.  ALLERGIES:  None known.  DISPOSITION:  For further history and physical, please see the dictated note.  HOSPITAL COURSE:  The patient was admitted and taken to the operating room and underwent the procedure as described above.  She tolerated the procedure well. It was necessary to take out both the right middle and right lower lobectomy along with a node dissection to get the entire mass out.  He tolerated the procedure well and returned to the 3300 unit in satisfactory condition.  Potassium was low the first postoperative day, this was replaced.  He was seen in consultation by hematology/oncology service and Dr. Marnee Guarneri of the oncology service.  He made slow steady progress.  Pain was initially controlled with an epidural.  This was removed on the third postoperative day.  He has been mobilized quite well.  He was transferred down to the floor on May 17, 2001.  He is ambulating now without difficulty and taking p.o.s well.  It was Dr. Viviann Spare C. Hendricksons opinion he would be ready for discharge in  the a.m.  Pathology showed a T2 N0 M0 based on the size of his mass.  He had a 9 cm poorly differentiated adenocarcinoma of the right lower lobe with extension to but not through the visceral pleura.  Margins were negative.  Focal pleural fibrosis was present along with some atelectasis.  All lymph nodes were negative.  At this time, the patient is ambulating without difficulty.  O2 saturations are 90-92% on room air.  He is  afebrile.  Wounds looked good.  We anticipate discharge in the a.m. on May 19, 2001.  DISCHARGE MEDICATIONS:  He is to resume his preadmission medicines as before and Tylox 1-2 p.o. q.4h. p.r.n. and Vioxx 25 mg 1 q.d. x 7 days.  ACTIVITY:  Light to moderate.  No lifting over 10 pounds.  No driving.  No strenuous activity.  DIET:  He is to resume a regular diet.  WOUND CARE:  He is to clean the incision with plain soap and water.  Call Dr. Katrinka Blazing for follow-up.  Dr. Nancy Marus office will contact him for follow-up and staple removal next week to be followed by visit to see Dr. Algis Downs. Karle Plumber with a chest x-ray from the [facility] the following week.  CONDITION ON DISCHARGE:  Improving.  LABORATORY DATA:  Electrolytes are normal.  BUN is 13, creatinine is 0.8, glucose was elevated at 146.  Hemoglobin is 11, hematocrit 34, white count 12.8, platelets were 273,000.  We will recheck those labs in the a.m. prior to discharge. Dictated by:   Sherrie George, P.A. Attending Physician:  Cameron Proud DD:  05/18/01 TD:  05/18/01 Job: [id number]

## 2011-01-06 NOTE — Op Note (Signed)
[place]. [facility]  Patient:    KYMERE, FULLINGTON Visit Number: [id number] MRN: [id number]          Service Type: SUR Location: [facility] 2899 30 Attending Physician:  Cameron Proud Dictated by:   D. Karle Plumber, M.D. Proc. Date: 05/14/01 Admit Date:  05/14/2001   CC:         Norton Blizzard, M.D. (2 copies)  Oley Balm. Simonds, M.D. [facility]   Operative Report  PREOPERATIVE DIAGNOSIS:  Right lower lobe mass.  POSTOPERATIVE DIAGNOSIS:  Adenocarcinoma right lower lobe.  OPERATION: 1. Fiberoptic bronchoscopy. 2. Right video-assisted thoracic surgery. 3. Right middle and lower lobectomy.  SURGEON:  D. Karle Plumber, M.D.  FIRST ASSISTANT:  Sherrie George, P.A.  ANESTHESIA:  General anesthesia.  DESCRIPTION OF PROCEDURE:  After general anesthesia, the dual-lumen tube was passed into the trachea and positioned in the left main stem bronchus.  The fiberoptic bronchoscope was passed through the dual-lume tube, and the right upper lobe and right middle lobe orifices were normal.  However, there was marked narrowing of the right lower lobe.  The left upper lobe and left lower lobe were seen to be normal.  The patient was turned to the right lateral thoracotomy position.  He was prepped and draped in the usual sterile manner.  Two trocar sites were made in the anterior and posterior axillary line at the seventh intercostal space, a 30-degree scope was inserted, and the cancer was seen in the right lower lobe. It appeared to be crossing the fissure to the right middle lobe.  A posterolateral thoracotomy was made, and latissimus was divided with the electrocautery and raised and reflected anteriorly.  The sixth intercostal space was entered.  A portion of the sixth rib was taken posteriorly at the angle.  A thin machete was inserted, a ______  placed at right angles. Exploration was carried out, and indeed the cancer was across the major fissure  into the right middle lobe so that the patient would need to have a right middle lobectomy in order to get adequate margins.  Inferior pulmonary ligament was taken down with electrocautery.  The inferior pulmonary vein was dissected out.  There was one branch of the inferior pulmonary vein that went to the middle lobe which was an anomaly, and this was doubly ligated with 2-0 silk, clipped, and divided.  Then the posterior hilum was dissected out, dissecting up to the azygos vein and then dissecting up to the bronchus.  Attention was then turned to the fissure, and dissection was carried down the fissure, identifying the pulmonary artery.  The pulmonary artery was dissected out to the basilar branches, and the superior segmental branch was dissected out, and then the medial branches to the middle lobe were all dissected out. Several 11R and one 10R nodes were removed.  The branch to the middle lobe was doubly ligated with 2-0 silk, clipped, and divided.  The superior segment of the branch was doubly ligated with 2-0 silk and divided.  Then the basilar branches were looped with the vascular tape, stapled with the Autosuture stapler, and divided.  The superior portion of the major fissure was divided in two applications with 45 vascular staple.  Then attention was turned to the minor fissure, and there two applications of the 60 Autosuture stapler were applied to partially divide it.  Attention was then turned to the vein, and then the venous branch to the right middle lobe superior portion of vein was  doubly ligated and divided, and then the rest of the inferior pulmonary vein was stapled with the Autosuture stapler and divided.  Then the rest of the minor fissure was dissected up along the artery, and the other branch to the right middle lobe, which is the medial branch, was ligated proximally and distally with 2-0 silk, clipped, and divided.  This opened up the minor fissure, and this was  completed with an other application of the Autosuture stapler.  Attention was then turned to the bronchus, and this was dissected free up to the takeoff of the right upper lobe, and the bronchus intermedius was stapled with a TL30 stapler and divided.  It was then oversewn with 4-0 Prolene in interrupted fashion.  Several 7, 11, and 10R nodes were removed.  The area was irrigated copiously.  FocalSeal was applied to the staple lines, applying first the primer and then the sealant, and using the ultraviolet wand for ______ .   Two chest tubes were brought in through separate stab wounds and sutured in place with 0 silk.  The lung was re-expanded.  The chest was closed with pericostals, #1 Vicryll in the muscle layer, 2-0 Vicry in the in subcutaneous tissue, and Ethicon skin clips.  The patient was returned to the recovery room in serious condition. Dictated by:   D. Karle Plumber, M.D. Attending Physician:  Cameron Proud DD:  05/14/01 TD:  05/14/01 Job: [phone]

## 2011-01-06 NOTE — Procedures (Signed)
[place]. [facility]  Patient:    [name], [name] Visit Number: [id number] MRN: [id number]          Service Type: SUR Location: 3300 3312 01 Attending Physician:  Cameron Proud Dictated by:   Edwin Cap. Zoila Shutter, M.D. Admit Date:  05/14/2001                             Procedure Report  INDICATIONS:  Mr. Vincelette is a 77 year old white male with a diagnosis of carcinoma of the lung, scheduled for pulmonary resection and for postoperative epidural analgesia.  This has been explained, understood and accepted by him preoperatively.  DESCRIPTION OF PROCEDURE:  At the termination of surgery, while still under general anesthesia, [name] remained in the left lateral decubitus position.  His back is prepped with Betadine and draped in the usual sterile fashion. Using midline approach and loss-of-resistance technique, sterile tap was accomplished at the T11-T12 interspace with a 17-gauge Tuohy needle.  This was followed by the passage of a Burron epidural catheter which was then checked for patency and affixed to the patients back.  The patient was then given a total of 10 cc of 1% lidocaine with 100 mcg of fentanyl.  There were no complications.  He did well and will be followed in the usual fashion. Dictated by:   Edwin Cap. Zoila Shutter, M.D. Attending Physician:  Cameron Proud DD:  05/14/01 TD:  05/14/01 Job: [id number]

## 2011-01-06 NOTE — H&P (Signed)
[place]. [facility]  Patient:    [name], [name] Visit Number: [id number] MRN: [id number]          Service Type: Attending:  D. Karle Plumber, M.D. Dictated by:   Durenda Age, P.A.-C. Adm. Date:  05/14/01   CC:         Harl Bowie, M.D., [place], [place]  Oley Balm. Sung Amabile, M.D. [facility]   History and Physical  DATE OF BIRTH:  Mar 20, 1931  CHIEF COMPLAINT:  Right lower lung lesion.  HISTORY OF PRESENT ILLNESS:  The patient is a pleasant 77 year old white male referred by Dr. Oley Balm. Simonds for evaluation of a right lower lobe mass, initially detected on a chest x-ray in February of this year.  At that time, he underwent a hernia repair.  Apparently, there was some miscommunication between the radiologist and the care providers, and no followup was arranged regarding this right lower lobe mass.  Incidentally, more recently he underwent a repeat chest x-ray by Dr. Lacretia Nicks. Chase Picket, and this demonstrated that the mass was still present, and, in fact, had grown substantially, and per CT, measured 6.3 x 7.1 cm, adjacent to the right lower lobe bronchus, with two smaller nodules laterally.  No significant mediastinal adenopathy or axillary adenopathy are seen, although there is a questionable hilar adenopathy.  Bone scan was positive at the tibial area.  Brain scan was negative.  The patient underwent a bronchoscopy in August 2002, which surprisingly was nondiagnostic, although there were some atypical cells.  This was not sufficient to make a definite diagnosis of malignancy.  Of note, this patient is a former smoker.  PFTs showed an FEV-1 of 3.32 with an FVC of 4.76. Based on the findings, Dr. Algis Downs Karle Plumber recommended to proceed with right middle lobectomy with node dissection, which is scheduled for May 14, 2001.  The patient is essentially asymptomatic, with no cough, sputum production, fever or chills.  No unexplained weight loss.  He does  have GERD symptoms, which are chronic.  No shortness of breath or dyspnea on exertion.  No paroxysmal nocturnal dyspnea or hemoptysis.  No angina or arrhythmias.  No symptoms of TIA or CVA, although he experiences at times dizziness, which is being evaluated per [facility] on May 13, 2001.  No amaurosis fugax.  No history of pneumonia, PE or DVT.  PAST MEDICAL HISTORY:  Questionable history of mitral regurgitation.  History of nephrolithiasis, hypertension, osteoarthritis, hypercholesterolemia, anxiety.  Remote history of tobacco use.  PAST SURGICAL HISTORY:  Status post inguinal hernia repair on September 28, 2000, status post lithotripsies from the year 1962 to 1981.  MEDICATIONS: 1. Spironolactone/HCTZ 25/25 mg q.d. 2. Multivitamins q.d. 3. Vitamin E 400 IU q.d. 4. Glucosamine 1000 mg b.i.d.  ALLERGIES:  No known drug allergies.  REVIEW OF SYSTEMS:  See HPI and past medical history for significant positives.  The patient denies any history of diabetes.  He has positive knee pain, which, per Dr. Phylliss Bob, there is a question of neoplasm around the area. FAMILY HISTORY:  Mother died of unknown causes.  Father died of heart disease and a history of colon cancer.  He had one sister who died with emphysema; she also had a history of breast cancer.  SOCIAL HISTORY:  Married.  One grown child.  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  He also retired from [employer], which he spent ______ in service, and had significant exposure to asbestos.  He quit in 1974 the use  of tobacco. He denies any alcohol intake.  PHYSICAL EXAMINATION:  GENERAL:  Well-developed, well-nourished 77 year old white male in no acute distress, alert and oriented x 3, mildly anxious and tearful, secondary to the scheduled surgery.  VITAL SIGNS:  Blood pressure 150/60, pulse 78, respirations 18.  HEENT:  Normocephalic, atraumatic.  PERRLA.  EOMI.  Funduscopic exam within normal limits.  Bilateral arcus  senilis.  NECK:  Supple.  No JVD, bruits or lymphadenopathy.  CHEST:  Symmetrical on inspiration.  LUNGS:  No wheezes or rhonchi.  Decreased breath sounds in the right lower lobe.  Dry crackles bilaterally.  NODES:  No lymphadenopathy.  CARDIOVASCULAR:  Regular rate and rhythm, 1/6 diastolic murmur at the apex. No rubs or gallops.  ABDOMEN:  Soft, nontender.  Bowel sounds x 4.  No masses or bruits.  GU:  Deferred.  RECTAL:  Deferred.  EXTREMITIES:  No clubbing or cyanosis.  Edema 1+ bilaterally.  The right shoulder has a lesion present for about four months, which is suspicious for superficial skin cancer.  He also has in the extremities, tenderness to palpation at the knee area, and 1+ edema bilaterally.  PERIPHERAL PULSES:  Carotids, femorals, popliteals, dorsalis pedis 2+ bilaterally.  NEUROLOGIC:  Nonfocal.  Gait steady.  DTRs 2+ bilaterally.  Muscle strength 5/5.  ASSESSMENT AND PLAN:  Right middle lobectomy, possibly right lower lobectomy at [facility] on May 14, 2001.  Dr. Edwyna Shell has seen and evaluated this patient prior to this admission and has explained the risks and benefits of the procedure and the patient has agreed to continue. Dictated by:   Durenda Age, P.A.-C. Attending:  D. Karle Plumber, M.D. DD:  05/10/01 TD:  05/10/01 Job: [id number]

## 2011-01-06 NOTE — Procedures (Signed)
[facility]  Patient:    [name], [name] Visit Number: [id number] MRN: [id number]          Service Type: DSU Location: CARD Attending Physician:  Merwyn Katos Dictated by:   Oley Balm Sung Amabile, M.D. [facility] Proc. Date: 04/15/01 Admit Date:  04/15/2001 Discharge Date: 04/15/2001                             Procedure Report  DATE OF BIRTH:  Feb 26, 1931  PROCEDURE:  Bronchoscopy.  SURGEON:  Oley Balm. Sung Amabile, M.D.  INDICATIONS:  Right lower lobe mass.  SEDATION:  Versed 8 mg IV, Demerol 50 mg IV.  ANESTHESIA:  Topical anesthesia was applied to the nose and throat, and 60 cc of 1% lidocaine were used during the course of the procedure.  DESCRIPTION OF PROCEDURE:  After adequate sedation and anesthesia, the bronchoscope was introduced via the right nares and was advanced into the posterior pharynx.  This demonstrated normal upper airway and anatomy.  Vocal cords moved symmetrically.  Further anesthesia was achieved with 1% lidocaine and the scope was advanced in the trachea.  Complete airway anesthesia was achieved with 1% lidocaine and an airway examination was performed.  This demonstrated normal bronchial mucosa.  There were no endobronchial tumors, masses, or foreign bodies.  There was no evidence of bleeding and minimal secretions.  After completion of the airway examination, the bronchoscope was advanced into the right lower lobe where washings were obtained.  Under fluoroscopic guidance, brushings and transbronchial biopsies were obtained.  Three biopsies were taken and the third resulted in moderated bleeding.  Consequently, further biopsies were not obtained after this.  The bleeding resolved spontaneously.  The above specimens were sent to pathology and surgical pathology.  The patient tolerated the procedure well without other complications. Dictated by:   Oley Balm Sung Amabile, M.D. [facility] Attending Physician:  Merwyn Katos DD:   05/02/01 TD:  05/03/01 Job: [id number]

## 2011-01-06 NOTE — Op Note (Signed)
[place]. [facility]  Patient:    [name], NIEMANN Visit Number: [id number] MRN: [id number]          Service Type: DSU Location: [facility] 2899 33 Attending Physician:  Cameron Proud Dictated by:   D. Karle Plumber, M.D. Admit Date:  10/21/2001                             Operative Report  PREOPERATIVE DIAGNOSIS:  Status post right lower lobectomy with chronic bronchitis.  POSTOPERATIVE DIAGNOSIS:  Status post right lower lobectomy with chronic bronchitis.  OPERATION PERFORMED:  Fiberoptic bronchoscopy, removal of retained suture.  SURGEON:  D. Karle Plumber, M.D.  ANESTHESIA:  General.  DESCRIPTION OF PROCEDURE:  After adequate general anesthesia the laser bronchoscope was passed through the endotracheal tube.  The carina was in the midline.  The left mainstem, left upper lobe, and left lower lobe orifices were normal.  The right mainstem, right upper lobe orifices were normal. Where the bronchial stump was of the right lower lobe there was marked decrease in inflammation as compared to the last time that we had bronchoscoped him, and you could see a suture in the lateral margin.  This was grasped with a biopsy forceps and removed.  There was a small amount of bleeding that was irrigated clear.  The washings were sent for culture and cytology.  The patient was returned to the recovery room in stable condition. Dictated by:   D. Karle Plumber, M.D. Attending Physician:  Cameron Proud DD:  10/21/01 TD:  10/21/01 Job: [id number]

## 2011-01-06 NOTE — Consult Note (Signed)
[place]. [facility]  Patient:    [name], [name] Visit Number: [id number] MRN: [id number]          Service Type: SUR Location: 3300 3312 01 Attending Physician:  Cameron Proud Dictated by:   Lorette Ang, N.P. Proc. Date: 05/15/01 Admit Date:  05/14/2001   CC:         Oley Balm. Sung Amabile, M.D. [facility]  Helene Kelp, M.D.  D. Karle Plumber, M.D.   Consultation Report  DATE OF BIRTH:  1931/06/13  REASON FOR CONSULTATION:  New diagnosis of lung cancer.  REFERRING:  D. Karle Plumber, M.D.  HISTORY OF PRESENT ILLNESS:  Mr. Currier is a 77 year old man who reports that he was initially found to have an abnormal chest x-ray in February 2002, at which time he was undergoing hernia surgery.  He reports there was no follow-up regarding this.  The patient states that he did well until June 2002, when he developed persistently aching joints.  He was referred to Dr. Phylliss Bob for evaluation and was started on prednisone with no improvement.  A chest x-ray was obtained and demonstrated a right lower lobe mass.  A followup chest CT confirmed the presence of a right lower lobe mass immediately adjacent to the right lower lobe bronchus measuring 6.3 x 7.1 cm, as well as two small (less than 1 cm) nodules immediately lateral to the mass.  The patient was referred to Dr. Sung Amabile and underwent bronchoscopy on April 15, 2001 with bronchial washings, brushings, and transbronchial biopsy taken.  These were nondiagnostic of malignancy ([id number]; [id number]; [id number]).  The patient was next referred to Dr. Edwyna Shell and underwent right VATS, right middle and lower lobectomy on May 14, 2001 with final pathology showing a poorly differentiated 9 cm adenocarcinoma of the right lower lobe with extension to, but not through, the visceral pleura; margins were negative; no involved lymph nodes ([id number]).  PAST MEDICAL HISTORY: 1. Hypertension. 2.  Osteoarthritis. 3. Hypercholesterolemia. 4. Questionable history of mitral regurgitation. 5. History of nephrolithiasis. 6. Anxiety. 7. Status post inguinal hernia repair February 2002.  MEDICATIONS: 1. Dulcolax 10 mg daily. 2. Zinacef 1.5 g q.12h. 3. Fentanyl epidural.  ALLERGIES:  IVP DYE.  FAMILY HISTORY:  Mother deceased age 64; father deceased with CAD; sister deceased with emphysema; sister alive with a history of breast cancer; brother living with diabetes mellitus; brother living with asthma, emphysema; and one sister who is reported to be healthy.  SOCIAL HISTORY:  Mr. [place]. They have three daughters who are all reported to be healthy.  The patient is employed as a Chartered certified accountant.  He reports that he served in the [employer] from [place] to 1957.  He reports a positive tobacco history, stating that he quit smoking in 1974 at 1-1/2 packs per day for 22 years.  He denies any ETOH use.  REVIEW OF SYSTEMS:  The patient denies any weight loss or anorexia.  He has had no fatigue.  He reports aching joints since June 2002.  He denies any fevers.  He has had no unusual headaches or vision changes.  He denies any hearing deficits.  He denies any shortness of breath.  He has had a cough.  He denies any hemoptysis.  He denies any chest pain.  He has had a recent ankle edema.  He has had no recent change in his bowel habits and denies any rectal bleeding.  He denies any hematuria or  dysuria.  He denies any falls or balance problems.  PHYSICAL EXAMINATION:  VITAL SIGNS:  Temperature 97.7, heart rate 98, respirations 20, blood pressure 140/68.  Oxygen saturation 99% on 2 L.  GENERAL:  White male in moderate pain related to movement.  HEENT:  Normocephalic, atraumatic.  Pupils equal, round, and reactive to light; extraocular movements are intact.  Oropharynx is clear.  LUNGS:  Clear anteriorly.  Right chest tube intact.  CARDIOVASCULAR:   Regular, tachycardic.  ABDOMEN:  Soft and nontender.  Bowel sounds hypoactive.  EXTREMITIES:  Trace bilateral ankle edema.  NEUROLOGIC:  Alert and oriented x 3.  Follows commands.  LABORATORY DATA:  Hemoglobin 12.3, white count 10.3, platelets 287,000. Sodium 140, potassium 2.9, BUN 12, creatinine 0.8, glucose 160, calcium 8.2.  Preoperative lab work:  Sodium 140, potassium 3.1, BUN 26, creatinine 0.9, glucose 114, total bilirubin 0.9, SGOT 16, SGPT 13, total protein 6.9, albumin 3.2, and calcium 8.9.  RADIOLOGY: 1. Chest CT:  Right lower lobe mass immediately adjacent to the right lower    lobe bronchus measuring 6.3 x 7.1 cm.  Two small, less than 1 cm, nodules    immediately lateral to the mass.  No mediastinal or axillary adenopathy.    Spleen, pancreas, adrenals, and kidneys normal.  Liver contains a    7.7 x 7.3 cm low-density area seen in the inferior portion of the right    lobe. 2. Brain CT:  Negative. 3. Bone scan:  Increased uptake right fifth rib, along periosteum of the lower    extremities bilaterally, knees, ankles, AC joints, and sternoclavicular    joints.  IMPRESSION AND PLAN:  Mr. Mollenkopf is a 77 year old man with newly diagnosed stage IB (T2, N0) adenocarcinoma of the right lower lobe, status post right middle and lower lobectomy; would not recommend adjuvant therapy.  He will require routine close follow-up as an outpatient.  We will continue to follow with you.  The patient seen and examined by Dr. Katrinka Blazing. Dictated by:   Lorette Ang, N.P. Attending Physician:  Cameron Proud DD:  05/16/01 TD:  05/16/01 Job: [id number]

## 2011-01-06 NOTE — Procedures (Signed)
[place]. [facility]  Patient:    [name], [name] Visit Number: [id number] MRN: [id number]          Service Type: DSU Location: [facility] 2899 17 Attending Physician:  Cameron Proud Dictated by:   D. Karle Plumber, M.D. Admit Date:  09/18/2001                             Procedure Report  PREOPERATIVE DIAGNOSIS:  Recurrent hemoptysis, status post right middle lobe and right lower lobe lobectomies for non-small-cell lung cancer.  POSTOPERATIVE DIAGNOSIS:  Recurrent hemoptysis, status post right middle lobe and right lower lobe lobectomies for non-small-cell lung cancer.  OPERATION PERFORMED:  Fiberoptic bronchoscopy.  SURGEON:  D. Karle Plumber, M.D.  ANESTHESIA:  Cetacaine, IV sedation and Xylocaine.  DESCRIPTION OF PROCEDURE:  After local anesthesia with Cetacaine and Xylocaine, the patient was given IV sedation and the fiberoptic bronchoscope was passed through the mouth into the tracheobronchial tree.  The carina was in the midline.  The left main stem, left upper lobe and left lower lobe orifices were normal, although there was some bronchitis.  On the right side, there was evidence of inflammation.  The right upper lobe showed bronchitis but there was evidence of some purulence on the bronchus intermedius stump and dissection was carried down and this was cleared out and one Prolene suture could be seen in the stump but there was evidence of either granulation tissue or recurrent cancer; multiple biopsies were taken, cytologies were taken and the fiberoptic bronchoscope was removed.  Patient was returned to the recovery room in stable condition. Dictated by:   D. Karle Plumber, M.D. Attending Physician:  Cameron Proud DD:  09/18/01 TD:  09/18/01 Job: [id number]

## 2011-01-06 NOTE — Discharge Summary (Signed)
NAME:  MARSELINO, [name] NO.:  [id number]   MEDICAL RECORD NO.:  [id number]                   PATIENT TYPE:  INP   LOCATION:  3309                                 FACILITY:  [facility]   PHYSICIAN:  Ines Bloomer, M.D.              DATE OF BIRTH:  December 06, 1930   DATE OF ADMISSION:  12/31/2002  DATE OF DISCHARGE:  01/04/2003                                 DISCHARGE SUMMARY   ADMISSION DIAGNOSES:  1. Enlarging right anterior chest wall mass.  2. Status post right middle and lower lobectomies for poorly differentiated     adenocarcinoma, T2, NO two years ago.  3. Hypertension.  4. History of mitral regurgitation.  5. Perigastroesophageal reflux disease.  6. Nephrolithiasis.  7. Hypercholesterolemia.   DISCHARGE DIAGNOSES:  1. Enlarging right anterior chest wall mass.  2. Status post right middle and lower lobectomies for poorly differentiated     adenocarcinoma, T2, NO two years ago.  3. Hypertension.  4. History of mitral regurgitation.  5. Perigastroesophageal reflux disease.  6. Nephrolithiasis.  7. Hypercholesterolemia.   PROCEDURE:  Resection anterior chest wall mass with reconstruction of the  chest wall using 2 mm Gore-Tex patch 12/31/02, Ines Bloomer, M.D.  Pathology revealed fibrous tissue with no malignancy.   BRIEF HISTORY:  The patient is a 77 year old white male, medical patient of  Dr. Thereasa Solo Dhatt in [place], [place].  The patient underwent right  thoracotomy with middle and lower lobectomies for poorly differentiated  carcinoma in 9/02.  He has done well since then, but recent followup, by Dr.  Edwyna Shell shows a new right pleural based 3 cm mass.  Needle biopsy was  performed which was positive for benign scar tissue.  He returned for  followup on 4/04.  CT scan showed the mass had now increased in size to 2.6  x 5.3 cm.  He underwent a PIP scan which showed intermediate activity and it  was Dr. Scheryl Darter opinion this might  be a metastasis.  He recommended he  undergo resection of the mass and the patient was admitted electively for  surgery at this time.   PAST MEDICAL HISTORY:  \  1. Right lung adenocarcinoma, Stage 1B, T2, NO.  2. Hypertension.  3. Osteoarthritis.  4. Hypercholesterolemia.  5. History of mitral regurgitation.  6. Nephrolithiasis.  7. Gastroesophageal reflux disease.  8. Chronic dizziness.   ADMISSION MEDICATIONS:  1. Diovan 80/12.5, 1 daily.  2. Zantac 75 mg p.o. q.h.s.  3. Coated aspirin 81 mg daily.  4. Centrum Silver, 1 daily.  5. Vitamin E 400 units daily.  6. Dramamine 1 daily or p.r.n.  7. Benadryl 1 daily.   ALLERGIES:  IV DYE which causes a rash.   For further history and physical, please see the dictated note.   HOSPITAL COURSE/FOLLOWUP:  The patient was admitted, taken to the OR and  underwent procedure as described above.  He tolerated the procedure well.  Frozen section showed a spindle cell mass which was not to be benign.  He  did well on 3300 and he was mobilized.  His chest tubes were removed and he  has made good progress.  He has had no postoperative complications.  Pathology came back showing fibrous tissue with no malignancy.  By 01/04/03,  he was ambulating without difficulty, incisions looked good and it was Dr.  Scheryl Darter opinion he could be discharged home.  Plan to discharge him home on  his preadmission medicines as before.  He also is given Tylox 1-2 p.o. q.4h.  p.r.n.  He will return in seven to ten days to see Dr. Edwyna Shell with a chest x-  ray from the [facility].  Followup with Dr. Sherlyn Lick as needed.   DISCHARGE LABORATORY:  Electrolytes are normal.  Glucose was as high as 137.  Total protein 5.6, albumin 2.7, LFT's were normal.  White count 10.6.  On  5/13 today, it was down to 8.2.  Hemoglobin and hematocrit are stable.   DISCHARGE CONDITION:  Improved.       Eber Hong, P.A.                 Ines Bloomer, M.D.    WDJ/MEDQ  D:   01/04/2003  T:  01/05/2003  Job:  [id number]   cc:   Harl Bowie, M.D.-[place]

## 2011-01-06 NOTE — Assessment & Plan Note (Signed)
OFFICE VISIT   [name], [name]  DOB:  08/27/1930                                        July 11, 2007  CHART #:  [id number]   It has been 6 years since the surgery and the CT scan that shows no  evidence of recurrence.  He is doing well overall.  His blood pressure  is 155/70.  Pulse 55.  Respirations 18 I will  see him back again in 6  months for the chest x-ray and I will repeat a CT scan in 1 year.   Ines Bloomer, M.D.  Electronically Signed   DPB/MEDQ  D:  07/11/2007  T:  07/12/2007  Job:  [id number]

## 2017-08-21 DIAGNOSIS — C439 Malignant melanoma of skin, unspecified: Secondary | ICD-10-CM

## 2017-08-21 HISTORY — DX: Malignant melanoma of skin, unspecified: C43.9

## 2017-08-21 HISTORY — PX: EXCISIONAL HEMORRHOIDECTOMY: SHX1541

## 2017-09-20 DIAGNOSIS — J449 Chronic obstructive pulmonary disease, unspecified: Secondary | ICD-10-CM

## 2017-09-20 DIAGNOSIS — R1011 Right upper quadrant pain: Secondary | ICD-10-CM | POA: Diagnosis not present

## 2017-09-20 DIAGNOSIS — K529 Noninfective gastroenteritis and colitis, unspecified: Secondary | ICD-10-CM

## 2017-09-20 DIAGNOSIS — R079 Chest pain, unspecified: Secondary | ICD-10-CM | POA: Diagnosis not present

## 2017-09-20 DIAGNOSIS — I129 Hypertensive chronic kidney disease with stage 1 through stage 4 chronic kidney disease, or unspecified chronic kidney disease: Secondary | ICD-10-CM | POA: Diagnosis not present

## 2017-09-21 DIAGNOSIS — R079 Chest pain, unspecified: Secondary | ICD-10-CM

## 2017-09-21 DIAGNOSIS — K529 Noninfective gastroenteritis and colitis, unspecified: Secondary | ICD-10-CM | POA: Diagnosis not present

## 2017-09-21 DIAGNOSIS — I129 Hypertensive chronic kidney disease with stage 1 through stage 4 chronic kidney disease, or unspecified chronic kidney disease: Secondary | ICD-10-CM | POA: Diagnosis not present

## 2017-09-21 DIAGNOSIS — R1011 Right upper quadrant pain: Secondary | ICD-10-CM | POA: Diagnosis not present

## 2017-09-21 DIAGNOSIS — J449 Chronic obstructive pulmonary disease, unspecified: Secondary | ICD-10-CM | POA: Diagnosis not present

## 2017-10-12 DIAGNOSIS — R079 Chest pain, unspecified: Secondary | ICD-10-CM | POA: Diagnosis not present

## 2017-10-12 DIAGNOSIS — E785 Hyperlipidemia, unspecified: Secondary | ICD-10-CM | POA: Diagnosis not present

## 2017-10-12 DIAGNOSIS — I10 Essential (primary) hypertension: Secondary | ICD-10-CM | POA: Diagnosis not present

## 2017-10-12 DIAGNOSIS — N183 Chronic kidney disease, stage 3 (moderate): Secondary | ICD-10-CM

## 2017-10-12 DIAGNOSIS — E039 Hypothyroidism, unspecified: Secondary | ICD-10-CM | POA: Diagnosis not present

## 2017-10-13 DIAGNOSIS — E785 Hyperlipidemia, unspecified: Secondary | ICD-10-CM | POA: Diagnosis not present

## 2017-10-13 DIAGNOSIS — N183 Chronic kidney disease, stage 3 (moderate): Secondary | ICD-10-CM | POA: Diagnosis not present

## 2017-10-13 DIAGNOSIS — R079 Chest pain, unspecified: Secondary | ICD-10-CM | POA: Diagnosis not present

## 2017-10-13 DIAGNOSIS — I10 Essential (primary) hypertension: Secondary | ICD-10-CM | POA: Diagnosis not present

## 2019-05-20 ENCOUNTER — Encounter: Payer: Self-pay | Admitting: *Deleted

## 2019-05-20 NOTE — Progress Notes (Signed)
Oncology Nurse Navigator Documentation  Oncology Nurse Navigator Flowsheets 05/20/2019  Navigator Location [facility]  Referral Date to RadOnc/MedOnc 05/16/2019  Navigator Encounter Type Other/I received a call from APP in [place] regarding Mr. Streng's appt with Dr. Lisbeth Renshaw.  I followed up with Dr. Lisbeth Renshaw and he will have his scheduling dept call and get patient set up with an appt.  I called APP back to update.   Treatment Phase Pre-Tx/Tx Discussion  Barriers/Navigation Needs Coordination of Care  Interventions Coordination of Care  Coordination of Care Other  Acuity Level 2-Minimal Needs (1-2 Barriers Identified)  Time Spent with Patient 30

## 2019-05-21 ENCOUNTER — Telehealth: Payer: Self-pay | Admitting: Radiation Oncology

## 2019-05-22 ENCOUNTER — Ambulatory Visit
Admission: RE | Admit: 2019-05-22 | Discharge: 2019-05-22 | Disposition: A | Discharge status: Home / Self Care | Payer: Medicare HMO | Source: Ambulatory Visit | Attending: Radiation Oncology | Admitting: Radiation Oncology

## 2019-05-22 ENCOUNTER — Encounter: Payer: Self-pay | Admitting: *Deleted

## 2019-05-22 ENCOUNTER — Other Ambulatory Visit: Payer: Self-pay

## 2019-05-22 ENCOUNTER — Encounter: Payer: Self-pay | Admitting: Radiation Oncology

## 2019-05-22 DIAGNOSIS — C3411 Malignant neoplasm of upper lobe, right bronchus or lung: Secondary | ICD-10-CM

## 2019-05-22 DIAGNOSIS — C3431 Malignant neoplasm of lower lobe, right bronchus or lung: Secondary | ICD-10-CM

## 2019-05-22 HISTORY — DX: Obstructive sleep apnea (adult) (pediatric): G47.33

## 2019-05-22 HISTORY — DX: Gastro-esophageal reflux disease without esophagitis: K21.9

## 2019-05-22 HISTORY — DX: Calculus of kidney: N20.0

## 2019-05-22 HISTORY — DX: Essential (primary) hypertension: I10

## 2019-05-22 HISTORY — DX: Malignant neoplasm of unspecified part of unspecified bronchus or lung: C34.90

## 2019-05-22 HISTORY — DX: Pure hypercholesterolemia, unspecified: E78.00

## 2019-05-22 HISTORY — DX: Unspecified osteoarthritis, unspecified site: M19.90

## 2019-05-22 NOTE — Progress Notes (Signed)
Thoracic Location of Tumor / Histology:   Patient presented   PET 2/44/6286: Hypermetabolic for the dominant posterior right upper lobe nodule that was highly worrisome for adenocarcinoma.  The 6 mm nodule in the central right upper lobe could also represent an early adenocarcinoma.  The 7 mm posterior right upper lobe nodule does not show abnormal hypermetabolism but is to small for PET resolution.  It is stable from 09/20/2017.  CT Chest 04/08/2019: significant enlargement of a spiculated appearing subpleural opacity of the posterior inferior right upper lobe that measures at least 1.1 x 1.1 cm and significantly increased in solid appearance. Additional unchanged ground glass opacity of the posterior right upper lobe measuring approximately 6 mm.  Biopsies of   Tobacco/Marijuana/Snuff/ETOH use: Non smoker  Past/Anticipated interventions by cardiothoracic surgery, if any:  Dr. Arlyce Dice  Past/Anticipated interventions by medical oncology, if any:  NP Eugenio Hoes 05/14/2019 -I told the patient that he had the option of observing this lesion or we could see if radiation oncology could use stereotactic on the lesion as it is small. -I have discussed this case with Dr. Bobby Rumpf who discussed the PET with IR.  IR agrees with the possibility of stereotactic radiation.    Signs/Symptoms  Weight changes, if any: Stable, gained a few pounds.  Respiratory complaints, if any: SOB with increased activities.  No sure if its just age related.  Hemoptysis, if any: No  Pain issues, if any:  No  SAFETY ISSUES:  Prior radiation? No  Pacemaker/ICD? No  Possible current pregnancy? n/a  Is the patient on methotrexate? No  Current Complaints / other details:   -History of lung cancer that was diagnosed in 2002 which was non-small cell lung cancer.  He had an upper and lower lobectomy and did not require chemotherapy.  One year later in 2003 he had chest wall recurrence that was resected and  according to the patient had a false rib replaced.

## 2019-05-22 NOTE — Progress Notes (Signed)
Radiation Oncology         [phone] ________________________________  Initial Outpatient Consultation - Conducted via telephone due to current COVID-19 concerns for limiting patient exposure  I spoke with the patient to conduct this consult visit via telephone to spare the patient unnecessary potential exposure in the healthcare setting during the current COVID-19 pandemic. The patient was notified in advance and was offered a [place] meeting to allow for face to face communication but unfortunately reported that they did not have the appropriate resources/technology to support such a visit and instead preferred to proceed with a telephone consult.   ________________________________  Name: [name]        MRN: [id number]  Date of Service: 05/22/2019 DOB: 1931/06/02  ES:PQZRA, Rosalyn Charters, MD  Marice Potter, MD     REFERRING PHYSICIAN: Marice Potter, MD   DIAGNOSIS: Diagnoses of Malignant neoplasm of right upper lobe of lung ([facility]) and Malignant neoplasm of lower lobe of right lung [facility]) were pertinent to this visit.   HISTORY OF PRESENT ILLNESS: [name] is a 85 y.o. male seen at the request of Dr. Bobby Rumpf for a remote history of Stage I, NSCLC of the RLL who underwent lobectomy with Dr. Arlyce Dice. Pathology is no longer available for review in the epic system. He apparently had a local recurrence which prompted re-excision of the chest wall requiring prostethetic rib placement. He was followed for many years in surveillance without recurrence. He was recently found to have a new nodule in the RLL on CT imaging in August 2020 measuring 1cm . There were two additional nodules that were 6 and 7 mm  in the RUL. He underwent a PET scan on 04/18/19. There was a 1cm nodule in the RLL with an SUV of 3.2, and the 6 mm RUL nodule had an SUV of 1.6, and the 7 mm nodule was too low for PET detection. Given his age and comorbidities he was not a candidate for biopsy or surgical resection. He is  contacted to discuss stereotactic body radiotherapy for putative stage I lung cancer.   PREVIOUS RADIATION THERAPY: No   PAST MEDICAL HISTORY:  Past Medical History:  Diagnosis Date   Arthritis    Gastroesophageal reflux    Hypercholesteremia    Hypertension    Kidney stones    Lung cancer ([place]) 2002-2003   Melanoma ([facility]) 2019   Bilateral arms   Obstructive sleep apnea        PAST SURGICAL HISTORY: Past Surgical History:  Procedure Laterality Date   CHEST WALL TUMOR EXCISION Left 2003   EXCISIONAL HEMORRHOIDECTOMY  2019   LOBECTOMY Right 2002   Lower lobe     FAMILY HISTORY:  Family History  Problem Relation Age of Onset   Colon cancer Father    Lung cancer Sister    Breast cancer Daughter      SOCIAL HISTORY:  reports that he has quit smoking. His smoking use included cigarettes. He smoked 1.00 pack per day. He has never used smokeless tobacco. He reports previous alcohol use. The patient is married and lives in [place]. He's a retired Furniture conservator/restorer.   ALLERGIES: Iodinated diagnostic agents   MEDICATIONS:  Current Outpatient Medications  Medication Sig Dispense Refill   aspirin EC 81 MG tablet Take by mouth.     Glucosamine 500 MG CAPS 1 capsule with a meal     levothyroxine (SYNTHROID) 50 MCG tablet TAKE 1 AND 1/2 TABLETS EVERY MORNING ON AN EMPTY  STOMACH     losartan-hydrochlorothiazide (HYZAAR) 100-25 MG tablet      lovastatin (MEVACOR) 40 MG tablet      Multiple Vitamin (MULTI-VITAMIN) tablet Take by mouth.     nitroGLYCERIN (NITROSTAT) 0.4 MG SL tablet 1 tablet under the tongue and allow to dissolve as needed     Omega-3 Fatty Acids (FISH OIL) 1200 MG CPDR 1 capsule     pregabalin (LYRICA) 75 MG capsule      spironolactone (ALDACTONE) 25 MG tablet      No current facility-administered medications for this encounter.      REVIEW OF SYSTEMS: On review of systems, the patient reports that he is doing well overall. He denies any  chest pain, shortness of breath, cough, fevers, chills, night sweats, unintended weight changes. He denies chest wall pain. He  denies any bowel or bladder disturbances, and denies abdominal pain, nausea or vomiting. He denies any new musculoskeletal or joint aches or pains. A complete review of systems is obtained and is otherwise negative.     PHYSICAL EXAM:  Wt Readings from Last 3 Encounters:  05/22/19 209 lb 8 oz (95 kg)   Unable to assess due to encounter type   ECOG = 0  0 - Asymptomatic (Fully active, able to carry on all predisease activities without restriction)  1 - Symptomatic but completely ambulatory (Restricted in physically strenuous activity but ambulatory and able to carry out work of a light or sedentary nature. For example, light housework, office work)  2 - Symptomatic, <50% in bed during the day (Ambulatory and capable of all self care but unable to carry out any work activities. Up and about more than 50% of waking hours)  3 - Symptomatic, >50% in bed, but not bedbound (Capable of only limited self-care, confined to bed or chair 50% or more of waking hours)  4 - Bedbound (Completely disabled. Cannot carry on any self-care. Totally confined to bed or chair)  5 - Death   Eustace Pen MM, Creech RH, Tormey DC, et al. [phone]). "Toxicity and response criteria of the [facility]". [place] Oncol. 5 (6): 649-55    LABORATORY DATA:  No results found for: WBC, HGB, HCT, MCV, PLT No results found for: NA, K, CL, CO2 No results found for: ALT, AST, GGT, ALKPHOS, BILITOT    RADIOGRAPHY: No results found.     IMPRESSION/PLAN: 1. Putative Stage IA1, cT1aN0M0 NSCLC of the RLL. Dr. Lisbeth Renshaw discusses the pathology findings and reviews the nature of early lung cancer. While tissue confirmation would be ideal, he is not a good candidate for this procedure due to risks being greater than the benefit. Likewise surgery is the gold standard to treat early stage  disease but he is not a candidate for this procedure. Rather, he is a candidate for stereotactic body radiotherapy (SBRT). We discussed the risks, benefits, short, and long term effects of radiotherapy, and the patient is interested in proceeding. Dr. Lisbeth Renshaw discusses the delivery and logistics of radiotherapy and anticipates a course of 3-5 fractions of radiotherapy to the RLL. He will come next week for simulation at which time he will sign written consent to proceed. 2. RUL nodules. The two lesions in the RUL seen on recent imaging will be followed at the completion of SBRT in surveillance as well. He is aware of the possibility to need treatment with radiotherapy if they progress in size. 3. Remote history of Stage I, NSCLC of the RLL. This will continue  to be followed expectantly on subsequent surveillance scans.  Given current concerns for patient exposure during the COVID-19 pandemic, this encounter was conducted via telephone.  The patient has given verbal consent for this type of encounter. The time spent during this encounter was 45 minutes and 50% of that time was spent in the coordination of his care. The attendants for this meeting include Dr. Lisbeth Renshaw, Blenda Nicely, RN, Shona Simpson, [facility] and Silver Huguenin  During the encounter, Dr. Lisbeth Renshaw, Blenda Nicely, RN, and Shona Simpson [facility] was located at [facility] Radiation Oncology Department.  Silver Huguenin  was located at home.  The above documentation reflects my direct findings during this shared patient visit. Please see the separate note by Dr. Lisbeth Renshaw on this date for the remainder of the patient's plan of care.    Carola Rhine, PAC

## 2019-05-23 ENCOUNTER — Encounter: Payer: Self-pay | Admitting: Radiation Oncology

## 2019-05-28 ENCOUNTER — Ambulatory Visit
Admission: RE | Admit: 2019-05-28 | Discharge: 2019-05-28 | Disposition: A | Discharge status: Home / Self Care | Payer: Medicare HMO | Source: Ambulatory Visit | Attending: Radiation Oncology | Admitting: Radiation Oncology

## 2019-05-28 ENCOUNTER — Other Ambulatory Visit: Payer: Self-pay

## 2019-05-28 DIAGNOSIS — Z510 Encounter for antineoplastic radiation therapy: Secondary | ICD-10-CM | POA: Insufficient documentation

## 2019-05-28 DIAGNOSIS — C3431 Malignant neoplasm of lower lobe, right bronchus or lung: Secondary | ICD-10-CM | POA: Diagnosis present

## 2019-06-04 DIAGNOSIS — Z510 Encounter for antineoplastic radiation therapy: Secondary | ICD-10-CM | POA: Diagnosis not present

## 2019-06-10 ENCOUNTER — Ambulatory Visit
Admission: RE | Admit: 2019-06-10 | Discharge: 2019-06-10 | Disposition: A | Discharge status: Home / Self Care | Payer: Medicare HMO | Source: Ambulatory Visit | Attending: Radiation Oncology | Admitting: Radiation Oncology

## 2019-06-10 ENCOUNTER — Other Ambulatory Visit: Payer: Self-pay

## 2019-06-10 DIAGNOSIS — Z510 Encounter for antineoplastic radiation therapy: Secondary | ICD-10-CM | POA: Diagnosis not present

## 2019-06-12 ENCOUNTER — Other Ambulatory Visit: Payer: Self-pay

## 2019-06-12 ENCOUNTER — Ambulatory Visit
Admission: RE | Admit: 2019-06-12 | Discharge: 2019-06-12 | Disposition: A | Discharge status: Home / Self Care | Payer: Medicare HMO | Source: Ambulatory Visit | Attending: Radiation Oncology | Admitting: Radiation Oncology

## 2019-06-12 DIAGNOSIS — Z510 Encounter for antineoplastic radiation therapy: Secondary | ICD-10-CM | POA: Diagnosis not present

## 2019-06-17 ENCOUNTER — Encounter: Payer: Self-pay | Admitting: Radiation Oncology

## 2019-06-17 ENCOUNTER — Ambulatory Visit
Admission: RE | Admit: 2019-06-17 | Discharge: 2019-06-17 | Disposition: A | Discharge status: Home / Self Care | Payer: Medicare HMO | Source: Ambulatory Visit | Attending: Radiation Oncology | Admitting: Radiation Oncology

## 2019-06-17 ENCOUNTER — Other Ambulatory Visit: Payer: Self-pay

## 2019-06-17 DIAGNOSIS — C3411 Malignant neoplasm of upper lobe, right bronchus or lung: Secondary | ICD-10-CM

## 2019-06-17 DIAGNOSIS — Z510 Encounter for antineoplastic radiation therapy: Secondary | ICD-10-CM | POA: Diagnosis not present

## 2019-06-17 DIAGNOSIS — C3431 Malignant neoplasm of lower lobe, right bronchus or lung: Secondary | ICD-10-CM

## 2019-06-17 NOTE — Progress Notes (Signed)
  Radiation Oncology         [phone]) [phone] ________________________________  Name: [name] MRN: [id number]  Date of Service: 06/17/2019  DOB: May 26, 1931  Post Treatment Note  Diagnosis:    Putative Stage IA1, cT1aN0M0 NSCLC of the RLL  Interval Since Last Radiation:  Today  06/10/2019-06/17/2019 SBRT Treatment:  The RLL target was treated to 54 Gy in 3 fractions   Narrative:  The patient was seen today following his last treatment. During treatment he has done very well with radiotherapy and has not developed significant desquamation. He continues to be hard of hearing. He denies any new trouble with shortness of breath, chest pain, fevers or chills. No other complaints are noted.  Impression/Plan: 1.         Putative Stage IA1, cT1aN0M0 NSCLC of the RLL. The patient tolerated his radiotherapy well. We will send him for a repeat CT chest in 6 weeks to assess treatment reponse. He is in agreement and will be contacted to proceed with this CT without contrast due to his dye allergy. I will follow up with these results when available. He may also continue with Dr. Bobby Rumpf depending on next steps and review of his findings in the RUL as well. He was given precautions to call with questions or concerns. 2.         RUL nodules. The two lesions in the RUL seen on recent imaging will be followed at the completion of SBRT in surveillance as well. He is still aware of the possibility to need treatment with radiotherapy if they progress in size. 3.         Remote history of Stage I, NSCLC of the RLL. This will continue to be followed expectantly on subsequent surveillance scans.    Carola Rhine, PAC

## 2019-07-10 NOTE — Progress Notes (Signed)
  Radiation Oncology         [phone] ________________________________  Name: [name] MRN: [id number]  Date: 06/17/2019  DOB: 1931-02-22  End of Treatment Note  Diagnosis:   stage I non-small cell lung cancer of the right lung    Indication for treatment::  curative       Radiation treatment dates:   06/10/19 - 06/17/19  Site/dose:   The patient was treated to the right lung with a course of stereotactic body radiation treatment.  The patient received 54 Gray in 3 fractions using a IMRT technique, with 3 fields.  Narrative: The patient tolerated radiation treatment relatively well.   No unexpected difficulties.  The patient's breathing did not significantly change during the course of the treatment.  Plan: The patient has completed radiation treatment. The patient will return to radiation oncology clinic for routine followup in one month. I advised the patient to call or return sooner if they have any questions or concerns related to their recovery or treatment. ________________________________  Jodelle Gross, M.D., Ph.D.

## 2019-07-22 ENCOUNTER — Telehealth: Payer: Self-pay | Admitting: *Deleted

## 2019-07-22 NOTE — Telephone Encounter (Signed)
"  [name] [phone]).  I need records sent to me to send to My cancer insurance company "[employer]".  Receive radiation treatments under Dr. Lisbeth Renshaw.  I receive payment when they receive proof I have received cancer treatments.  I submit annually."  Message left with [facility] lead voicemail with patient request.  Unable to confirm return call time with [name] saying he "should receive return call after lunch today".

## 2019-07-22 NOTE — Telephone Encounter (Signed)
Mail medical records to pt on 07/22/19

## 2019-07-22 NOTE — Telephone Encounter (Signed)
Called patient to inform of CT for 07-29-19 - arrival time- 10:30 am  @ [facility], no restrictions to test, patient to receive results from Shona Simpson on 08-04-19 @ 2 pm, spoke with patient and he is aware of these appts.

## 2019-07-22 NOTE — Progress Notes (Signed)
  Radiation Oncology         [phone]) [phone] ________________________________  Name: [name] MRN: [id number]  Date: 05/28/2019  DOB: 15-Oct-1930  RESPIRATORY MOTION MANAGEMENT SIMULATION  NARRATIVE:  In order to account for effect of respiratory motion on target structures and other organs in the planning and delivery of radiotherapy, this patient underwent respiratory motion management simulation.  To accomplish this, when the patient was brought to the CT simulation planning suite, 4D respiratoy motion management CT images were obtained.  The CT images were loaded into the planning software.  Then, using a variety of tools including Cine, MIP, and standard views, the target volume and planning target volumes (PTV) were delineated.  Avoidance structures were contoured.  Treatment planning then occurred.  Dose volume histograms were generated and reviewed for each of the requested structure.  The resulting plan was carefully reviewed and approved today.   ------------------------------------------------  Jodelle Gross, MD, PhD

## 2019-07-22 NOTE — Progress Notes (Signed)
[facility] Radiation Oncology Simulation and Treatment Planning Note   Name:  [name] MRN: [id number]   Date: 05/28/2019  DOB: 04-May-1931  Status:outpatient    DIAGNOSIS:    ICD-10-CM   1. Malignant neoplasm of lower lobe of right lung ([name])  C34.31      CONSENT VERIFIED:yes   SET UP: Patient is setup supine   IMMOBILIZATION: The patient was immobilized using a customized Vac Loc bag/ blue bag and customized accuform device   NARRATIVE:The patient was brought to the [facility].  Identity was confirmed.  All relevant records and images related to the planned course of therapy were reviewed.  Then, the patient was positioned in a stable reproducible clinical set-up for radiation therapy. Abdominal compression was applied by me.  4D CT images were obtained and reproducible breathing pattern was confirmed. Free breathing CT images were obtained.  Skin markings were placed.  The CT images were loaded into the planning software where the target and avoidance structures were contoured.  The radiation prescription was entered and confirmed.    TREATMENT PLANNING NOTE:  Treatment planning then occurred. I have requested : IMRT planning.This treatment technique is medically necessary due to the high-dose of radiation delivered to the target region which is in close proximity to adjacent critical normal structures.  3 dimensional simulation is performed and dose volume histogram of the gross tumor volume, planning tumor volume and criticial normal structures including the spinal cord and lungs were analyzed and requested.  Special treatment procedure was performed due to high dose per fraction.  The patient will be monitored for increased risk of toxicity.  Daily imaging using cone beam CT will be used for target localization.  I anticipate that the patient will receive 54 Gy in 3 fractions to target volume. Further adjustments will be made based on  the planning process is necessary.  ------------------------------------------------  Jodelle Gross, MD, PhD

## 2019-07-28 ENCOUNTER — Telehealth: Payer: Self-pay | Admitting: Radiation Oncology

## 2019-07-28 DIAGNOSIS — C3431 Malignant neoplasm of lower lobe, right bronchus or lung: Secondary | ICD-10-CM

## 2019-07-28 DIAGNOSIS — C3411 Malignant neoplasm of upper lobe, right bronchus or lung: Secondary | ICD-10-CM

## 2019-07-28 NOTE — Telephone Encounter (Signed)
  Radiation Oncology         [phone]) [phone] ________________________________  Name: [name] MRN: [id number]  Date of Service: 07/28/2019  DOB: 11-Sep-1930  Post Treatment Telephone Note  Diagnosis:   Putative Stage IA1, cT1aN0M0 NSCLC of the RLL  Interval Since Last Radiation:  6 weeks   06/10/2019-06/17/2019 SBRT Treatment:  The RLL target was treated to 54 Gy in 3 fractions  Narrative:  The patient was contacted today for routine follow-up. During treatment he did very well with radiotherapy and did not have significant desquamation. He went for a CT on 07/28/2019 that revealed stability in the right lung over the treatment site. There appeared to be a dictation error stating it was the RUL, however on further imaging review, it is in the RLL corresponding to the PET scan prior to his therapy see below. The other two lesions in the RUL were evaluated and the 9 mm lesion was stable, and the 6 mm lesion previously now was measured at 30mm.     Impression/Plan: 1. Putative Stage IA1, cT1aN0M0 NSCLC of the RLL. The patient has been doing well since completion of radiotherapy. We discussed that we were pleased with the findings, but the RLL was what was being measured. We will plan to continue to follow him in surveillance.     2.RUL nodules. The two lesions in the RUL seen on prior imaging will be followed at the completion of SBRT in surveillance as well. He is still aware of the possibility to need treatment with radiotherapy if they progress in size and for this reason will repeat one more CT in 3 months time prior to going back to 6 month intervals for #1.  3.Remote history ofStage I, NSCLC of the RLL. This will continue to be followed expectantly on subsequent surveillance scans.    Carola Rhine, PAC

## 2019-07-29 ENCOUNTER — Encounter: Payer: Self-pay | Admitting: *Deleted

## 2019-08-04 ENCOUNTER — Ambulatory Visit: Payer: Self-pay | Admitting: Radiation Oncology

## 2019-08-04 ENCOUNTER — Encounter: Payer: Self-pay | Admitting: Radiation Oncology

## 2019-08-04 NOTE — Progress Notes (Signed)
Requested in the lobby by Verdie Drown. Patient upset and frustrated. Patient verbalized, he was unaware the appointment to follow up with Shona Simpson, PA-C today at 2 pm was cancelled. Explained to the patient the appointment was cancelled because he spoke with Bryson Ha over the phone on 07/28/2019 about his CT results thus there was no need to come in. Apologized for the misunderstanding. Listened to the patient as he aired his frustrations. Explained his next appointment is scheduled for 11/03/2019 to review his next CT scan in 3 months. Patient verbalized understanding and denied further needs. Walked patient to lobby. Patient exited the facility calm and in no distress.

## 2019-09-22 ENCOUNTER — Telehealth: Payer: Self-pay | Admitting: *Deleted

## 2019-09-22 NOTE — Telephone Encounter (Signed)
On 09/22/19 fax medical records to [facility] [address]

## 2019-09-23 ENCOUNTER — Telehealth: Payer: Self-pay | Admitting: Radiation Oncology

## 2019-09-23 NOTE — Telephone Encounter (Signed)
-----   Message from [place] sent at 09/23/2019 12:29 PM EST ----- Regarding: Patient call Hi Sam,    Sorry, I can't respond to the other message.  If he will call the billing department at [phone] they will send him itemized statements. Thanks,     [employer]

## 2019-09-23 NOTE — Telephone Encounter (Signed)
Phoned patient's home. Wife, Bonnita Nasuti, explains her husband isn't available. Provided her with the billing department number of [phone]. She verbalized understanding and committed to sharing this with her husband when he returns home.

## 2019-09-23 NOTE — Telephone Encounter (Signed)
Received voicemail message from patient requesting a return call. Phoned patient to inquire. Patient reports the insurance company is requesting an itemized statement. Patient questions where to get this. Explained this RN would speak with our financial advocate to determine best next steps. Explained myself or the appropriate source would reach out with directions. Patient verbalized understanding. Patient understand to contact this RN with future needs related to radiation.

## 2019-10-24 ENCOUNTER — Telehealth: Payer: Self-pay | Admitting: *Deleted

## 2019-10-24 NOTE — Telephone Encounter (Signed)
Called patient to inform of CT for 10-29-19 @ [facility] Radiology- arrival time- 9:30 am, no restrictions to test, patient to receive results on 11-03-19 via telephone by Shona Simpson @ 1 pm, lvm for a return call

## 2019-10-30 ENCOUNTER — Ambulatory Visit
Admission: RE | Admit: 2019-10-30 | Discharge: 2019-10-30 | Disposition: A | Discharge status: Home / Self Care | Payer: Medicare HMO | Source: Ambulatory Visit | Attending: Radiation Oncology | Admitting: Radiation Oncology

## 2019-10-30 DIAGNOSIS — C3411 Malignant neoplasm of upper lobe, right bronchus or lung: Secondary | ICD-10-CM

## 2019-10-30 DIAGNOSIS — C3431 Malignant neoplasm of lower lobe, right bronchus or lung: Secondary | ICD-10-CM

## 2019-10-31 ENCOUNTER — Encounter: Payer: Self-pay | Admitting: *Deleted

## 2019-11-03 ENCOUNTER — Ambulatory Visit: Payer: Medicare HMO | Admitting: Radiation Oncology

## 2019-11-03 ENCOUNTER — Other Ambulatory Visit: Payer: Self-pay | Admitting: Radiation Oncology

## 2019-11-03 ENCOUNTER — Other Ambulatory Visit: Payer: Self-pay

## 2019-11-03 DIAGNOSIS — C3490 Malignant neoplasm of unspecified part of unspecified bronchus or lung: Secondary | ICD-10-CM

## 2019-11-03 NOTE — Progress Notes (Signed)
Radiation Oncology         [phone] ________________________________  Outpatient Follow Up - Conducted via telephone due to current COVID-19 concerns for limiting patient exposure  I spoke with the patient to conduct this consult visit via telephone to spare the patient unnecessary potential exposure in the healthcare setting during the current COVID-19 pandemic. The patient was notified in advance and was offered a [place] meeting to allow for face to face communication but unfortunately reported that they did not have the appropriate resources/technology to support such a visit and instead preferred to proceed with a telephone visit.   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 gentleman with a history of a stage I non-small cell lung cancer who underwent lobectomy with Dr. Arlyce Dice. Pathology is no longer available for review in the epic system. He apparently had a local recurrence which prompted re-excision of the chest wall requiring prostethetic rib placement. He was followed for many years in surveillance without recurrence.  He developed a new nodule in the right lower lobe in the summer 2020 and PET scan in August 2020 revealed a 1 cm nodule in the right lower lobe with an SUV of 3.2, a right upper lobe nodule measuring 6 mm with an SUV of 1.6, and a 7 mm nodule too low for PET detection.  Given his age and comorbidities he was not a candidate for surgical resection or biopsy and proceeded with treatment of a putative stage Ia non-small cell lung cancer of the right lower lobe in October 2020.  He tolerated treatment well.  He went for a posttreatment CT on 07/28/2019 that revealed  stability in the right lung over the treatment site. There appeared to be a dictation error stating it was the RUL, however on further imaging review, it is in the RLL corresponding to the PET scan prior to his therapy see below. The other two lesions in the RUL were evaluated and the 9 mm lesion was stable, and the 6 mm lesion previously now was measured at 44mm.    He has been followed in surveillance since this time, he did undergo a CT scan at [facility] on 10/29/2019.  The groundglass nodule in the posterior segment of the right upper lobe measures 8 x 11 mm stable from 2019, 4 mm central right upper lobe nodule also was stable from that time, new consolidation in the posterior segment of the right upper lobe was noted consistent with prior radiotherapy and a subpleural nodule measuring 12 mm was read as stable.  Adjacent pleural thickening and a probable 5 mm subpleural lymph node along the left hemidiaphragm was also noted and unchanged.  Stability and cystic changes of bilateral adrenal glands were again noted.  He continues to have an enlarged pulmonary trunk emphysematous change and atherosclerotic disease as well as bilateral renal stones.  He is contacted today by phone to discuss these results and recommendations.  PAST MEDICAL HISTORY:  Past Medical History:  Diagnosis Date  . Arthritis   . Gastroesophageal reflux   . Hypercholesteremia   . Hypertension   . Kidney stones   . Lung cancer ([place]) 2002-2003  .  Melanoma ([place]) 2019   Bilateral arms  . Obstructive sleep apnea     PAST SURGICAL HISTORY: Past Surgical History:  Procedure Laterality Date  . CHEST WALL TUMOR EXCISION Left 2003  . EXCISIONAL HEMORRHOIDECTOMY  2019  . LOBECTOMY Right 2002   Lower lobe    PAST SOCIAL HISTORY:  Social History   Socioeconomic History  . Marital status: Married    Spouse name: Not on file  . Number of children: Not on file  . Years of education: Not on file  . Highest education  level: Not on file  Occupational History  . Occupation: retired    Comment: previous Furniture conservator/restorer  Tobacco Use  . Smoking status: Former Smoker    Packs/day: 1.00    Types: Cigarettes  . Smokeless tobacco: Never Used  . Tobacco comment: Smoked between 1950s-1970s  Substance and Sexual Activity  . Alcohol use: Not Currently  . Drug use: Not on file  . Sexual activity: Not on file  Other Topics Concern  . Not on file  Social History Narrative  . Not on file   Social Determinants of Health   Financial Resource Strain:   . Difficulty of Paying Living Expenses:   Food Insecurity:   . Worried About Charity fundraiser in the Last Year:   . Arboriculturist in the Last Year:   Transportation Needs: No Transportation Needs  . Lack of Transportation (Medical): No  . Lack of Transportation (Non-Medical): No  Physical Activity:   . Days of Exercise per Week:   . Minutes of Exercise per Session:   Stress:   . Feeling of Stress :   Social Connections:   . Frequency of Communication with Friends and Family:   . Frequency of Social Gatherings with Friends and Family:   . Attends Religious Services:   . Active Member of Clubs or Organizations:   . Attends Archivist Meetings:   Marland Kitchen Marital Status:   Intimate Partner Violence:   . Fear of Current or Ex-Partner:   . Emotionally Abused:   Marland Kitchen Physically Abused:   . Sexually Abused:   The patient is married and lives in [place].  PAST FAMILY HISTORY: Family History  Problem Relation Age of Onset  . Colon cancer Father   . Lung cancer Sister   . Breast cancer Daughter     MEDICATIONS  Current Outpatient Medications  Medication Sig Dispense Refill  . aspirin EC 81 MG tablet Take by mouth.    . Glucosamine 500 MG CAPS 1 capsule with a meal    . levothyroxine (SYNTHROID) 50 MCG tablet TAKE 1 AND 1/2 TABLETS EVERY MORNING ON AN EMPTY STOMACH    . losartan-hydrochlorothiazide (HYZAAR) 100-25 MG tablet     . lovastatin  (MEVACOR) 40 MG tablet     . Multiple Vitamin (MULTI-VITAMIN) tablet Take by mouth.    . nitroGLYCERIN (NITROSTAT) 0.4 MG SL tablet 1 tablet under the tongue and allow to dissolve as needed    . Omega-3 Fatty Acids (FISH OIL) 1200 MG CPDR 1 capsule    . pregabalin (LYRICA) 75 MG capsule     . spironolactone (ALDACTONE) 25 MG tablet      No current facility-administered medications for this encounter.    ALLERGIES:  Allergies  Allergen Reactions  . Iodinated Diagnostic Agents Hives    PHYSICAL EXAM: Unable to assess due to encounter type.   IMPRESSION/PLAN: 1. Putative Stage IA1, cT1aN0M0 NSCLC of the RLL. He  has been doing well and is appearing to show improvement and radiographic stability since his treatment.  We discussed the rationale for repeat scan in 6 months time.  He is in agreement with this plan.  He is also encouraged to call back if he has any questions or concerns that arise prior to that visit. 2.RUL nodules. The two lesions in the RUL seen on prior imaging will be followed expectantly but do not warrant treatment at this time. He is in agreement with this plan.  3.Remote history ofStage I, NSCLC of the RLL. This will continue to be followed expectantly on subsequent surveillance scans.    Given current concerns for patient exposure during the COVID-19 pandemic, this encounter was conducted via telephone.  The patient has provided two factor identification and has given verbal consent for this type of encounter and has been advised to only accept a meeting of this type in a secure network environment. The time spent during this encounter was 35 minutes including preparation, discussion, and coordination of the patient's care. The attendants for this meeting [place]  and Silver Huguenin.  During the encounter, Hayden Pedro was located at [facility] Radiation Oncology Department.  Silver Huguenin was located  at home.     Carola Rhine, PAC

## 2020-04-13 ENCOUNTER — Telehealth: Payer: Self-pay | Admitting: *Deleted

## 2020-04-13 NOTE — Telephone Encounter (Signed)
Called patient to inform of CT for 05-05-20 - arrival time- 10:30 am  @ [facility] Radiology, no restrictions  to test, no answer, no vm, will call later.

## 2020-05-10 ENCOUNTER — Ambulatory Visit
Admission: RE | Admit: 2020-05-10 | Discharge: 2020-05-10 | Disposition: A | Discharge status: Home / Self Care | Payer: Medicare HMO | Source: Ambulatory Visit | Attending: Radiation Oncology | Admitting: Radiation Oncology

## 2020-05-10 DIAGNOSIS — C3431 Malignant neoplasm of lower lobe, right bronchus or lung: Secondary | ICD-10-CM

## 2020-05-10 DIAGNOSIS — C3411 Malignant neoplasm of upper lobe, right bronchus or lung: Secondary | ICD-10-CM

## 2020-05-10 NOTE — Progress Notes (Addendum)
Radiation Oncology         ([phone] ________________________________  Outpatient Follow Up - Conducted via telephone due to current COVID-19 concerns for limiting patient exposure  I spoke with the patient to conduct this consult visit via telephone to spare the patient unnecessary potential exposure in the healthcare setting during the current COVID-19 pandemic. The patient was notified in advance and was offered a [place] meeting to allow for face to face communication but unfortunately reported that they did not have the appropriate resources/technology to support such a visit and instead preferred to proceed with a telephone visit.   Name: STEPHAN NELIS MRN: [id number]  Date of Service: 05/10/2020  DOB: [birth year]  Post Treatment Telephone Note  Diagnosis:   Putative Stage IA1, cT1aN0M0 NSCLC of the RLL  Interval Since Last Radiation:  11 months  06/10/2019-06/17/2019 SBRT Treatment:  The RLL target was treated to 54 Gy in 3 fractions  NARRATIVE: Mr. Garman is a pleasant 86 y.o. gentleman with a history of a stage I non-small cell lung cancer who underwent lobectomy with Dr. Arlyce Dice in 2002 . Pathology is no longer available for review in the epic system. He apparently had a local recurrence which prompted re-excision of the chest wall requiring prostethetic rib placement. He was followed for many years in surveillance without recurrence.  He developed a new nodule in the right lower lobe in the summer 2020 and PET scan in August 2020 revealed a 1 cm nodule in the right lower lobe with an SUV of 3.2, a right upper lobe nodule measuring 6 mm with an SUV of 1.6, and a 7 mm nodule too low for PET detection.  Given his age and comorbidities he was not a candidate for surgical resection or biopsy and proceeded with treatment of a putative stage Ia non-small cell lung cancer of the right lower lobe in October 2020.  He tolerated treatment well.  He went for a posttreatment CT on 07/28/2019 that  revealed stability in the right lung over the treatment site. There appeared to be a dictation error stating it was the RUL, however on further imaging review, it is in the RLL corresponding to the PET scan prior to his therapy see below. The other two lesions in the RUL were evaluated and the 9 mm lesion was stable, and the 6 mm lesion previously now was measured at 78mm.    He has been followed in surveillance since this time, CT on 05/04/20 revealed resolution of one of the two lesions in the RUL, but the persistent was 7 mm, stable in comparison to his March scan. He has post treatment changes in the RLL consistent with prior surgery and radiotherpay without new or progressive nodules. He has stigmata of atherosclerotic disease of the coronary and aortic vessels, emphysema, cystic changes of the adrenal glands, right kidney and too small to characterize changes in the liver. A 1.6 cm left thyroid nodule was also noted. He's contacted today to discuss these findings.   On review of systems, the patient reports that he is doing well overall. He denies any shortness of breath, cough, fevers, chills, night sweats, unintended weight changes. He has occasional chest pain that resolves with nitroglycerin. No other complaints are verbalized.   PAST MEDICAL HISTORY:  Past Medical History:  Diagnosis Date  . Arthritis   . Gastroesophageal reflux   . Hypercholesteremia   . Hypertension   . Kidney stones   . Lung cancer ([place]) 2002-2003  . Melanoma (  [place]) 2019   Bilateral arms  . Obstructive sleep apnea     PAST SURGICAL HISTORY: Past Surgical History:  Procedure Laterality Date  . CHEST WALL TUMOR EXCISION Left 2003  . EXCISIONAL HEMORRHOIDECTOMY  2019  . LOBECTOMY Right 2002   Lower lobe    PAST SOCIAL HISTORY:  Social History   Socioeconomic History  . Marital status: Married    Spouse name: Not on file  . Number of children: Not on file  . Years of education: Not on file  . Highest  education level: Not on file  Occupational History  . Occupation: retired    Comment: previous Furniture conservator/restorer  Tobacco Use  . Smoking status: Former Smoker    Packs/day: 1.00    Types: Cigarettes  . Smokeless tobacco: Never Used  . Tobacco comment: Smoked between 1950s-1970s  Substance and Sexual Activity  . Alcohol use: Not Currently  . Drug use: Not on file  . Sexual activity: Not on file  Other Topics Concern  . Not on file  Social History Narrative  . Not on file   Social Determinants of Health   Financial Resource Strain:   . Difficulty of Paying Living Expenses: Not on file  Food Insecurity:   . Worried About Charity fundraiser in the Last Year: Not on file  . Ran Out of Food in the Last Year: Not on file  Transportation Needs: No Transportation Needs  . Lack of Transportation (Medical): No  . Lack of Transportation (Non-Medical): No  Physical Activity:   . Days of Exercise per Week: Not on file  . Minutes of Exercise per Session: Not on file  Stress:   . Feeling of Stress : Not on file  Social Connections:   . Frequency of Communication with Friends and Family: Not on file  . Frequency of Social Gatherings with Friends and Family: Not on file  . Attends Religious Services: Not on file  . Active Member of Clubs or Organizations: Not on file  . Attends Archivist Meetings: Not on file  . Marital Status: Not on file  Intimate Partner Violence:   . Fear of Current or Ex-Partner: Not on file  . Emotionally Abused: Not on file  . Physically Abused: Not on file  . Sexually Abused: Not on file  The patient is married and lives in [place].  PAST FAMILY HISTORY: Family History  Problem Relation Age of Onset  . Colon cancer Father   . Lung cancer Sister   . Breast cancer Daughter     MEDICATIONS  Current Outpatient Medications  Medication Sig Dispense Refill  . aspirin EC 81 MG tablet Take by mouth.    . Glucosamine 500 MG CAPS 1 capsule with a meal     . levothyroxine (SYNTHROID) 50 MCG tablet TAKE 1 AND 1/2 TABLETS EVERY MORNING ON AN EMPTY STOMACH    . losartan-hydrochlorothiazide (HYZAAR) 100-25 MG tablet     . lovastatin (MEVACOR) 40 MG tablet     . Multiple Vitamin (MULTI-VITAMIN) tablet Take by mouth.    . nitroGLYCERIN (NITROSTAT) 0.4 MG SL tablet 1 tablet under the tongue and allow to dissolve as needed    . Omega-3 Fatty Acids (FISH OIL) 1200 MG CPDR 1 capsule    . pregabalin (LYRICA) 75 MG capsule     . spironolactone (ALDACTONE) 25 MG tablet      No current facility-administered medications for this encounter.    ALLERGIES:  Allergies  Allergen Reactions  .  Iodinated Diagnostic Agents Hives    PHYSICAL EXAM: Unable to assess due to encounter type.   IMPRESSION/PLAN: 1. Putative Stage IA1, cT1aN0M0 NSCLC of the RLL. The patient appears to be doing well since his last visit. We reviewed the findings on imaging and recommend repeat CT of the chest in 6 months time. He is in agreement and will contact us sooner if he has questions or concerns.  2.RUL nodules. The nodule in the RUL that is present now is only a single nodules as compared two two seen previously. We discussed that since it has been stable since  prior imaging, it will be followed expectantly but do not warrant treatment at this time. He is in agreement with this plan.  3.Remote history ofStage I, NSCLC of the RLL. This will continue to be followed expectantly on subsequent surveillance scans. 4. Thyroid nodule. I recommended the patient follow up with his PCP for further evaluation of thyroid function, and possible need for further work up of the nodule. He is in agreement with this plan.    Given current concerns for patient exposure during the COVID-19 pandemic, this encounter was conducted via telephone.  The patient has provided two factor identification and has given verbal consent for this type of encounter and has been advised to only  accept a meeting of this type in a secure network environment. The time spent during this encounter was 30 minutes including preparation, discussion, and coordination of the patient's care. The attendants for this meeting [place]  and Silver Huguenin.  During the encounter, Hayden Pedro was located at [facility] Radiation Oncology Department.  Silver Huguenin was located at home.     Carola Rhine, PAC

## 2020-11-01 ENCOUNTER — Telehealth: Payer: Self-pay

## 2020-11-01 ENCOUNTER — Encounter: Payer: Self-pay | Admitting: Radiation Oncology

## 2020-11-01 NOTE — Telephone Encounter (Signed)
Spoke with patient and wife in regards to telephone visit with Shona Simpson PA on 11/08/20 @ 1:30pm. Advised that this is a telephone visit and not in person. Patient verbalized understanding of appointment date and time. Called and reviewed meaningful use questions. TM

## 2020-11-05 ENCOUNTER — Telehealth: Payer: Self-pay | Admitting: *Deleted

## 2020-11-05 NOTE — Telephone Encounter (Signed)
CALLED PATIENT TO INFORM OF CT FOR  11-08-20 - ARRIVAL TIME- 3:30 PM @ [place] RADIOLOG[facility], NO RESTRICTIONS TO TEST, PATIENT TO RECEIVE RESULTS FROM ALISON PERKINS ON 11-09-20 @ 8:30 AM FOR RESULTS, SPOKE WITH PATIENT AND HE IS AWARE OF THESE APPTS.

## 2020-11-08 ENCOUNTER — Ambulatory Visit
Admission: RE | Admit: 2020-11-08 | Discharge: 2020-11-08 | Disposition: A | Discharge status: Home / Self Care | Payer: Medicare HMO | Source: Ambulatory Visit | Attending: Radiation Oncology | Admitting: Radiation Oncology

## 2020-11-09 ENCOUNTER — Telehealth: Payer: Self-pay | Admitting: *Deleted

## 2020-11-09 ENCOUNTER — Ambulatory Visit: Admission: RE | Admit: 2020-11-09 | Payer: Medicare HMO | Source: Ambulatory Visit | Admitting: Radiation Oncology

## 2020-11-09 NOTE — Telephone Encounter (Signed)
CALLED PATIENT TO INFORM THAT ALISON PERKINS WOULD CALL HIM TOMORROW AFTERNOON @ 1:30 PM TO GIVE HIM HIS RESULTS, DUE TO THE SCAN NOT BEING READ BY THE RADIOLOGIST IN [place], SPOKE WITH PATIENT AND HE VERIFIED UNDERSTANDING THIS

## 2020-11-10 ENCOUNTER — Other Ambulatory Visit: Payer: Self-pay

## 2020-11-10 ENCOUNTER — Ambulatory Visit
Admission: RE | Admit: 2020-11-10 | Discharge: 2020-11-10 | Disposition: A | Discharge status: Home / Self Care | Payer: Medicare HMO | Source: Ambulatory Visit | Attending: Radiation Oncology | Admitting: Radiation Oncology

## 2020-11-10 DIAGNOSIS — C3411 Malignant neoplasm of upper lobe, right bronchus or lung: Secondary | ICD-10-CM

## 2020-11-10 DIAGNOSIS — C3431 Malignant neoplasm of lower lobe, right bronchus or lung: Secondary | ICD-10-CM

## 2020-11-10 NOTE — Progress Notes (Signed)
Radiation Oncology         [phone] ________________________________  Outpatient Follow Up - Conducted via telephone due to current COVID-19 concerns for limiting patient exposure  I spoke with the patient to conduct this consult visit via telephone to spare the patient unnecessary potential exposure in the healthcare setting during the current COVID-19 pandemic. The patient was notified in advance and was offered a [place] meeting to allow for face to face communication but unfortunately reported that they did not have the appropriate resources/technology to support such a visit and instead preferred to proceed with a telephone visit.   Name: [name] MRN: [id number]  Date of Service: 11/10/2020  DOB: June 27, 1931  Post Treatment Telephone Note  Diagnosis:   Putative Stage IA1, cT1aN0M0 NSCLC of the RLL  Interval Since Last Radiation: 1 year 5 months  06/10/2019-06/17/2019 SBRT Treatment:  The RLL target was treated to 54 Gy in 3 fractions  NARRATIVE: [name] is a pleasant 87 y.o. gentleman with a history of a stage I non-small cell lung cancer who underwent lobectomy with Dr. Arlyce Dice in 2002 . Pathology is no longer available for review in the epic system. He apparently had a local recurrence which prompted re-excision of the chest wall requiring prostethetic rib placement. He was followed for many years in surveillance without recurrence.  He developed a new nodule in the right lower lobe in the summer 2020 and PET scan in August 2020 revealed a 1 cm nodule in the right lower lobe with an SUV of 3.2, a right upper lobe nodule measuring 6 mm with an SUV of 1.6, and a 7 mm nodule too low for PET detection.  Given his age and comorbidities he was not a candidate for surgical resection or biopsy and proceeded with treatment of a putative stage Ia non-small cell lung cancer of the right lower lobe in October 2020.  He tolerated treatment well.  He went for a posttreatment CT on 07/28/2019  that revealed stability in the right lung over the treatment site. There appeared to be a dictation error stating it was the RUL, however on further imaging review, it is in the RLL corresponding to the PET scan prior to his therapy see below. The other two lesions in the RUL were evaluated and the 9 mm lesion was stable, and the 6 mm lesion previously now was measured at 52mm.    He has been followed in surveillance since this time. He has had waxing and waning of a 5 mm RUL nodule and a stable 7 mm RUL nodule. Otherwise his prior sites of surgical and radiotherapy treatments are stable without concerns for new disease. He has stigmata of atherosclerotic disease of the coronary and aortic vessels, emphysema, cystic changes of the adrenal glands felt to be benign adenomas, right renal calculi. He's contacted by phone to review these results.  On review of systems, the patient reports that he is doing well overall. He denies any shortness of breath, cough, fevers, chills, night sweats, unintended weight changes. He does admit to changes in his memory and with hearing that he attributes to his age but no headaches, changes in reasoning, movement or speech. He continues to have occasional chest pain that resolves with nitroglycerin but has only taken this about 3-4 times in the last 6 months and reports that this continues to be effective. No other complaints are verbalized.   PAST MEDICAL HISTORY:  Past Medical History:  Diagnosis Date  . Arthritis   .  Gastroesophageal reflux   . Hypercholesteremia   . Hypertension   . Kidney stones   . Lung cancer ([place]) 2002-2003  . Melanoma ([place]) 2019   Bilateral arms  . Obstructive sleep apnea     PAST SURGICAL HISTORY: Past Surgical History:  Procedure Laterality Date  . CHEST WALL TUMOR EXCISION Left 2003  . EXCISIONAL HEMORRHOIDECTOMY  2019  . LOBECTOMY Right 2002   Lower lobe    PAST SOCIAL HISTORY:  Social History   Socioeconomic History  .  Marital status: Married    Spouse name: Not on file  . Number of children: Not on file  . Years of education: Not on file  . Highest education level: Not on file  Occupational History  . Occupation: retired    Comment: previous Furniture conservator/restorer  Tobacco Use  . Smoking status: Former Smoker    Packs/day: 1.00    Types: Cigarettes  . Smokeless tobacco: Never Used  . Tobacco comment: Smoked between 1950s-1970s  Substance and Sexual Activity  . Alcohol use: Not Currently  . Drug use: Not on file  . Sexual activity: Not on file  Other Topics Concern  . Not on file  Social History Narrative  . Not on file   Social Determinants of Health   Financial Resource Strain: Not on file  Food Insecurity: Not on file  Transportation Needs: Not on file  Physical Activity: Not on file  Stress: Not on file  Social Connections: Not on file  Intimate Partner Violence: Not on file  The patient is married and lives in [place].  PAST FAMILY HISTORY: Family History  Problem Relation Age of Onset  . Colon cancer Father   . Lung cancer Sister   . Breast cancer Daughter     MEDICATIONS  Current Outpatient Medications  Medication Sig Dispense Refill  . aspirin EC 81 MG tablet Take by mouth.    . Glucosamine 500 MG CAPS 1 capsule with a meal    . levothyroxine (SYNTHROID) 50 MCG tablet TAKE 1 AND 1/2 TABLETS EVERY MORNING ON AN EMPTY STOMACH    . losartan-hydrochlorothiazide (HYZAAR) 100-25 MG tablet     . lovastatin (MEVACOR) 40 MG tablet     . Multiple Vitamin (MULTI-VITAMIN) tablet Take by mouth.    . nitroGLYCERIN (NITROSTAT) 0.4 MG SL tablet 1 tablet under the tongue and allow to dissolve as needed    . Omega-3 Fatty Acids (FISH OIL) 1200 MG CPDR 1 capsule    . pregabalin (LYRICA) 75 MG capsule     . spironolactone (ALDACTONE) 25 MG tablet      No current facility-administered medications for this encounter.    ALLERGIES:  Allergies  Allergen Reactions  . Iodinated Diagnostic Agents  Hives    PHYSICAL EXAM: Unable to assess due to encounter type.   IMPRESSION/PLAN: 1. Putative Stage IA1, cT1aN0M0 NSCLC of the RLL.  The patient continues to do well clinically per report and radiographically, we discussed the rationale to continue to keep a close eye on his course with routine imaging of the chest every 6 months.  As he is unable to tolerate contrast we will continue to order these as noncontrast based CTs of the chest.  As he lives in [place] he continues to desire to have these performed at [facility].  He will contact me sooner if he has questions or concerns prior to that time. 2.RUL nodules.  There continues to be some waxing and waning of one of the right upper  lobe nodules, with stability of the other.  We will continue to follow this expectantly.   3.Remote history ofStage I, NSCLC of the RLL. This will continue to be followed expectantly on subsequent surveillance scans.    Given current concerns for patient exposure during the COVID-19 pandemic, this encounter was conducted via telephone.  The patient has provided two factor identification and has given verbal consent for this type of encounter and has been advised to only accept a meeting of this type in a secure network environment. The time spent during this encounter was 30 minutes including preparation, discussion, and coordination of the patient's care. The attendants for this meeting [place]  and Silver Huguenin.  During the encounter, Hayden Pedro was located at [facility] Radiation Oncology Department.  Silver Huguenin was located at home.     Carola Rhine, PAC

## 2021-01-26 ENCOUNTER — Telehealth: Payer: Self-pay | Admitting: Radiation Oncology

## 2021-01-26 NOTE — Telephone Encounter (Signed)
I received records from the patient's evaluation in the emergency room at [facility] on 12/30/2020.  While there he was diagnosed with pancreatitis.  He had abdominal pelvic and chest CT scans, his CT of the chest showed stability in both right upper lobe nodules that we have been following given his history of multifocal early stage lung cancer following SBRT treatment and remote history of lobectomy.  I shared with him that because he had a good report and no additional concerns for disease where surgery and radiation have also been performed that it would be reasonable to extend the interval further out for his next surveillance scan.  I recommended that we move his appointments as well from September to November of this year.  He encouraged is encouraged to contact me sooner if he has questions or concerns that arise prior to that visit.  Unfortunately he is also been diagnosed with COVID as of last Friday and is in quarantine still.  He feels as though he is getting better clinically.  We will consider this as well as we follow his imaging radiographically of the chest in the future.

## 2021-05-16 ENCOUNTER — Ambulatory Visit: Payer: Self-pay | Admitting: Radiation Oncology

## 2021-06-27 ENCOUNTER — Ambulatory Visit: Payer: Self-pay | Admitting: Radiation Oncology

## 2021-06-30 ENCOUNTER — Telehealth: Payer: Self-pay | Admitting: *Deleted

## 2021-06-30 NOTE — Telephone Encounter (Signed)
CALLED PATIENT TO INFORM OF CT FOR 07-12-21- ARRIVAL TIME- 4:15 PM @ [facility], NO RESTRICTIONS TO TEST AND PATIENT TO FU VIA TELEPHONE FOR RESULTS WITH ALISON PERKINS ON 07-18-21 2 3:00 PM, LVM FOR A RETURN CALL

## 2021-07-11 ENCOUNTER — Ambulatory Visit: Payer: Medicare HMO | Admitting: Radiation Oncology

## 2021-07-18 ENCOUNTER — Ambulatory Visit
Admission: RE | Admit: 2021-07-18 | Discharge: 2021-07-18 | Disposition: A | Discharge status: Home / Self Care | Payer: Medicare HMO | Source: Ambulatory Visit | Attending: Radiation Oncology | Admitting: Radiation Oncology

## 2021-07-18 ENCOUNTER — Encounter: Payer: Self-pay | Admitting: Radiation Oncology

## 2021-07-18 DIAGNOSIS — C3411 Malignant neoplasm of upper lobe, right bronchus or lung: Secondary | ICD-10-CM

## 2021-07-18 DIAGNOSIS — C3431 Malignant neoplasm of lower lobe, right bronchus or lung: Secondary | ICD-10-CM

## 2021-07-18 NOTE — Progress Notes (Signed)
Patient states he is doing well, other than his pain 3/10 due to neuropathy in bilateral legs. Patient is breathing well without chest pain. No other symptoms reported at this time.  Meaningful use complete.  Patient notified of 3:00pm -07/18/21 telephone appointment and verbalized understanding.  Patient preferred contact # [phone]

## 2021-07-18 NOTE — Progress Notes (Signed)
Radiation Oncology         [phone] ________________________________  Outpatient Follow Up - Conducted via telephone due to current COVID-19 concerns for limiting patient exposure  I spoke with the patient to conduct this consult visit via telephone to spare the patient unnecessary potential exposure in the healthcare setting during the current COVID-19 pandemic. The patient was notified in advance and was offered a [place] meeting to allow for face to face communication but unfortunately reported that they did not have the appropriate resources/technology to support such a visit and instead preferred to proceed with a telephone visit.   Name: [name] MRN: [id number]  Date of Service: 07/18/2021  DOB: 1930-12-13  Post Treatment Telephone Note  Diagnosis:   Putative Stage IA1, cT1aN0M0 NSCLC of the RLL  Prior Radiation:     06/10/2019-06/17/2019 SBRT Treatment:  The RLL target was treated to 54 Gy in 3 fractions  NARRATIVE: [name] is a pleasant 87 y.o. gentleman with a history of a stage I non-small cell lung cancer who underwent lobectomy with Dr. Arlyce Dice in 2002 . Pathology is no longer available for review in the epic system. He apparently had a local recurrence which prompted re-excision of the chest wall requiring prostethetic rib placement. He was followed for many years in surveillance without recurrence.  He developed a new nodule in the right lower lobe in the summer 2020 and PET scan in August 2020 revealed a 1 cm nodule in the right lower lobe with an SUV of 3.2, a right upper lobe nodule measuring 6 mm with an SUV of 1.6, and a 7 mm nodule too low for PET detection.  Given his age and comorbidities he was not a candidate for surgical resection or biopsy and proceeded with treatment of a putative stage Ia non-small cell lung cancer of the right lower lobe in October 2020.  He tolerated treatment well.  He went for a posttreatment CT on 07/28/2019 that revealed stability in  the right lung over the treatment site. There appeared to be a dictation error stating it was the RUL, however on further imaging review, it is in the RLL corresponding to the PET scan prior to his therapy see below. The other two lesions in the RUL were evaluated and the 9 mm lesion was stable, and the 6 mm lesion previously was measured at 76mm.    He has been followed in surveillance since this time. His most recent CT scan on11/22/22 that continued to show postoperative changes in the right lung. He continued to have  stigmata of atherosclerotic disease of the coronary and aortic vessels, emphysema, cystic changes of the adrenal glands. Stable changes of his known nodules including changes consistent with his prior therapy in the RLL. Trace right upper lobe effusion was noted as stable, and enlargement of the main pulmonary artery were also note along. He's contacted today to review these results.  Interestingly he has had a nodule described in the left lobe of the thyroid gland on his 05/05/20 CT scan. This was not noted in his most recent scan.  On review of systems, the patient continues to do well clinically. He still is able to get outside and do work on his yard and property. He drives but prefers not to at night. He reports his breathing is stable and he has shortness of breath with exertion but no progressive symptoms since our last visit per report. He discusses that he has bilateral peripheral neuropathy following his spinal  anesthesia in 2019 for hemorrhoid repair. His hearing is limited but overall he feels he is satisfied with his current state.   PAST MEDICAL HISTORY:  Past Medical History:  Diagnosis Date   Arthritis    Gastroesophageal reflux    Hypercholesteremia    Hypertension    Kidney stones    Lung cancer ([place]) 2002-2003   Melanoma ([place]) 2019   Bilateral arms   Obstructive sleep apnea     PAST SURGICAL HISTORY: Past Surgical History:  Procedure Laterality Date   CHEST  WALL TUMOR EXCISION Left 2003   EXCISIONAL HEMORRHOIDECTOMY  2019   LOBECTOMY Right 2002   Lower lobe    PAST SOCIAL HISTORY:  Social History   Socioeconomic History   Marital status: Married    Spouse name: Not on file   Number of children: Not on file   Years of education: Not on file   Highest education level: Not on file  Occupational History   Occupation: retired    Comment: previous Furniture conservator/restorer  Tobacco Use   Smoking status: Former    Packs/day: 1.00    Types: Cigarettes   Smokeless tobacco: Never   Tobacco comments:    Smoked between [employer]  Substance and Sexual Activity   Alcohol use: Not Currently   Drug use: Not on file   Sexual activity: Not on file  Other Topics Concern   Not on file  Social History Narrative   Not on file   Social Determinants of Health   Financial Resource Strain: Not on file  Food Insecurity: Not on file  Transportation Needs: Not on file  Physical Activity: Not on file  Stress: Not on file  Social Connections: Not on file  Intimate Partner Violence: Not on file  The patient is married and lives in [place]. He still enjoys mowing his yard and being active in his church.  PAST FAMILY HISTORY: Family History  Problem Relation Age of Onset   Colon cancer Father    Lung cancer Sister    Breast cancer Daughter     MEDICATIONS  Current Outpatient Medications  Medication Sig Dispense Refill   aspirin EC 81 MG tablet Take by mouth.     Glucosamine 500 MG CAPS 1 capsule with a meal     levothyroxine (SYNTHROID) 50 MCG tablet TAKE 1 AND 1/2 TABLETS EVERY MORNING ON AN EMPTY STOMACH     losartan-hydrochlorothiazide (HYZAAR) 100-25 MG tablet      lovastatin (MEVACOR) 40 MG tablet      Multiple Vitamin (MULTI-VITAMIN) tablet Take by mouth.     nitroGLYCERIN (NITROSTAT) 0.4 MG SL tablet 1 tablet under the tongue and allow to dissolve as needed     Omega-3 Fatty Acids (FISH OIL) 1200 MG CPDR 1 capsule     pregabalin (LYRICA) 75 MG  capsule      spironolactone (ALDACTONE) 25 MG tablet      No current facility-administered medications for this encounter.    ALLERGIES:  Allergies  Allergen Reactions   Iodinated Diagnostic Agents Hives    PHYSICAL EXAM: Unable to assess due to encounter type.   IMPRESSION/PLAN: 1. Putative Stage IA1, cT1aN0M0 NSCLC of the RLL. The patient continues to be radiographically without disease. He continues to functionally do well and we discussed continued plans for surveillance as long as medically he is doing well enough to tolerate having scans, and if new disease was noted, receiving treatment for this. We discussed the recommendations for repeat imaging in 6 months  per NCCN guidelines as he desires continued surveillance. 2.         RUL nodules. The two previously noted changes were described as one in May, but now again are described as stable but separate. We will follow this expectantly. 3.         Remote history of Stage I, NSCLC of the RLL. As per #1. He remains NED. This will continue to be followed expectantly on subsequent surveillance scans. 4. Thyroid nodule. This was noted on his CT in September 2021 but on his 07/12/21 CT was called as normal appearing thyroid in the report. I encouraged him to discuss this finding with Dr. London Pepper who manages his levothyroxine.     Given current concerns for patient exposure during the COVID-19 pandemic, this encounter was conducted via telephone.  The patient has provided two factor identification and has given verbal consent for this type of encounter and has been advised to only accept a meeting of this type in a secure network environment. The time spent during this encounter was 35 minutes including preparation, discussion, and coordination of the patient's care. The attendants for this meeting [place]  and Silver Huguenin.  During the encounter, Hayden Pedro was located at [facility] Radiation  Oncology Department.  Silver Huguenin was located at home.     Carola Rhine, PAC

## 2021-12-12 DIAGNOSIS — I440 Atrioventricular block, first degree: Secondary | ICD-10-CM | POA: Diagnosis not present

## 2021-12-15 ENCOUNTER — Inpatient Hospital Stay (HOSPITAL_COMMUNITY): Payer: Medicare HMO

## 2021-12-15 ENCOUNTER — Encounter (HOSPITAL_COMMUNITY): Payer: Self-pay | Admitting: Internal Medicine

## 2021-12-15 ENCOUNTER — Inpatient Hospital Stay (HOSPITAL_COMMUNITY)
Admission: AD | Admit: 2021-12-15 | Discharge: 2021-12-20 | DRG: 357 | Disposition: A | Discharge status: Skilled Nursing Facility | Payer: Medicare HMO | Source: Other Acute Inpatient Hospital | Attending: Internal Medicine | Admitting: Internal Medicine

## 2021-12-15 DIAGNOSIS — Z79890 Hormone replacement therapy (postmenopausal): Secondary | ICD-10-CM

## 2021-12-15 DIAGNOSIS — G4733 Obstructive sleep apnea (adult) (pediatric): Secondary | ICD-10-CM | POA: Diagnosis present

## 2021-12-15 DIAGNOSIS — K219 Gastro-esophageal reflux disease without esophagitis: Secondary | ICD-10-CM | POA: Diagnosis present

## 2021-12-15 DIAGNOSIS — K922 Gastrointestinal hemorrhage, unspecified: Secondary | ICD-10-CM | POA: Diagnosis present

## 2021-12-15 DIAGNOSIS — Z91041 Radiographic dye allergy status: Secondary | ICD-10-CM | POA: Diagnosis not present

## 2021-12-15 DIAGNOSIS — Z7982 Long term (current) use of aspirin: Secondary | ICD-10-CM

## 2021-12-15 DIAGNOSIS — C3480 Malignant neoplasm of overlapping sites of unspecified bronchus and lung: Secondary | ICD-10-CM

## 2021-12-15 DIAGNOSIS — D62 Acute posthemorrhagic anemia: Secondary | ICD-10-CM | POA: Diagnosis present

## 2021-12-15 DIAGNOSIS — E7800 Pure hypercholesterolemia, unspecified: Secondary | ICD-10-CM | POA: Diagnosis present

## 2021-12-15 DIAGNOSIS — Z85820 Personal history of malignant melanoma of skin: Secondary | ICD-10-CM | POA: Diagnosis not present

## 2021-12-15 DIAGNOSIS — N189 Chronic kidney disease, unspecified: Secondary | ICD-10-CM | POA: Diagnosis not present

## 2021-12-15 DIAGNOSIS — Z801 Family history of malignant neoplasm of trachea, bronchus and lung: Secondary | ICD-10-CM

## 2021-12-15 DIAGNOSIS — C3490 Malignant neoplasm of unspecified part of unspecified bronchus or lung: Secondary | ICD-10-CM | POA: Diagnosis present

## 2021-12-15 DIAGNOSIS — Z87442 Personal history of urinary calculi: Secondary | ICD-10-CM | POA: Diagnosis not present

## 2021-12-15 DIAGNOSIS — Z923 Personal history of irradiation: Secondary | ICD-10-CM | POA: Diagnosis not present

## 2021-12-15 DIAGNOSIS — N179 Acute kidney failure, unspecified: Secondary | ICD-10-CM

## 2021-12-15 DIAGNOSIS — E870 Hyperosmolality and hypernatremia: Secondary | ICD-10-CM | POA: Diagnosis present

## 2021-12-15 DIAGNOSIS — N1832 Chronic kidney disease, stage 3b: Secondary | ICD-10-CM | POA: Diagnosis present

## 2021-12-15 DIAGNOSIS — N400 Enlarged prostate without lower urinary tract symptoms: Secondary | ICD-10-CM | POA: Diagnosis present

## 2021-12-15 DIAGNOSIS — Z87891 Personal history of nicotine dependence: Secondary | ICD-10-CM | POA: Diagnosis not present

## 2021-12-15 HISTORY — PX: IR ANGIOGRAM VISCERAL SELECTIVE: IMG657

## 2021-12-15 HISTORY — PX: IR ANGIOGRAM SELECTIVE EACH ADDITIONAL VESSEL: IMG667

## 2021-12-15 HISTORY — PX: IR US GUIDE VASC ACCESS RIGHT: IMG2390

## 2021-12-15 HISTORY — PX: IR EMBO ART  VEN HEMORR LYMPH EXTRAV  INC GUIDE ROADMAPPING: IMG5450

## 2021-12-15 HISTORY — PX: IR TRANSCATH/EMBOLIZ: IMG695

## 2021-12-15 LAB — COMPREHENSIVE METABOLIC PANEL
ALT: 17 U/L (ref 0–44)
AST: 17 U/L (ref 15–41)
Albumin: 2.4 g/dL — ABNORMAL LOW (ref 3.5–5.0)
Alkaline Phosphatase: 26 U/L — ABNORMAL LOW (ref 38–126)
Anion gap: 4 — ABNORMAL LOW (ref 5–15)
BUN: 108 mg/dL — ABNORMAL HIGH (ref 8–23)
CO2: 18 mmol/L — ABNORMAL LOW (ref 22–32)
Calcium: 8.3 mg/dL — ABNORMAL LOW (ref 8.9–10.3)
Chloride: 127 mmol/L — ABNORMAL HIGH (ref 98–111)
Creatinine, Ser: 2.91 mg/dL — ABNORMAL HIGH (ref 0.61–1.24)
GFR, Estimated: 20 mL/min — ABNORMAL LOW (ref >60)
Glucose, Bld: 150 mg/dL — ABNORMAL HIGH (ref 70–99)
Potassium: 4.1 mmol/L (ref 3.5–5.1)
Sodium: 149 mmol/L — ABNORMAL HIGH (ref 135–145)
Total Bilirubin: 0.4 mg/dL (ref 0.3–1.2)
Total Protein: 4.5 g/dL — ABNORMAL LOW (ref 6.5–8.1)

## 2021-12-15 LAB — CBC
HCT: 26.5 % — ABNORMAL LOW (ref 39.0–52.0)
Hemoglobin: 8.5 g/dL — ABNORMAL LOW (ref 13.0–17.0)
MCH: 29.4 pg (ref 26.0–34.0)
MCHC: 32.1 g/dL (ref 30.0–36.0)
MCV: 91.7 fL (ref 80.0–100.0)
Platelets: 158 10*3/uL (ref 150–400)
RBC: 2.89 MIL/uL — ABNORMAL LOW (ref 4.22–5.81)
RDW: 18.7 % — ABNORMAL HIGH (ref 11.5–15.5)
WBC: 16.1 10*3/uL — ABNORMAL HIGH (ref 4.0–10.5)
nRBC: 0.9 % — ABNORMAL HIGH (ref 0.0–0.2)

## 2021-12-15 LAB — HEMOGLOBIN AND HEMATOCRIT, BLOOD
HCT: 25.6 % — ABNORMAL LOW (ref 39.0–52.0)
HCT: 26 % — ABNORMAL LOW (ref 39.0–52.0)
Hemoglobin: 8.3 g/dL — ABNORMAL LOW (ref 13.0–17.0)
Hemoglobin: 8.3 g/dL — ABNORMAL LOW (ref 13.0–17.0)

## 2021-12-15 LAB — TYPE AND SCREEN
ABO/RH(D): A POS
Antibody Screen: NEGATIVE

## 2021-12-15 LAB — PROTIME-INR
INR: 1.3 — ABNORMAL HIGH (ref 0.8–1.2)
Prothrombin Time: 16 seconds — ABNORMAL HIGH (ref 11.4–15.2)

## 2021-12-15 LAB — ABO/RH: ABO/RH(D): A POS

## 2021-12-15 MED ORDER — MIDAZOLAM HCL 2 MG/2ML IJ SOLN
INTRAMUSCULAR | Status: AC
  Filled 2021-12-15: qty 4

## 2021-12-15 MED ORDER — LIDOCAINE HCL 1 % IJ SOLN
INTRAMUSCULAR | Status: AC
  Filled 2021-12-15: qty 20

## 2021-12-15 MED ORDER — PREDNISONE 20 MG PO TABS
50.0000 mg | ORAL_TABLET | Freq: Four times a day (QID) | ORAL | Status: DC
  Administered 2021-12-15 (×2): 50 mg via ORAL
  Filled 2021-12-15 (×2): qty 1

## 2021-12-15 MED ORDER — PANTOPRAZOLE INFUSION (NEW) - SIMPLE MED
8.0000 mg/h | INTRAVENOUS | Status: DC
  Administered 2021-12-15 – 2021-12-16 (×4): 8 mg/h via INTRAVENOUS
  Filled 2021-12-15: qty 80
  Filled 2021-12-15: qty 100
  Filled 2021-12-15 (×2): qty 80
  Filled 2021-12-15: qty 100

## 2021-12-15 MED ORDER — DIPHENHYDRAMINE HCL 25 MG PO CAPS
50.0000 mg | ORAL_CAPSULE | Freq: Once | ORAL | Status: AC
  Administered 2021-12-15: 50 mg via ORAL
  Filled 2021-12-15: qty 2

## 2021-12-15 MED ORDER — DIPHENHYDRAMINE HCL 50 MG/ML IJ SOLN: 50.0000 mg | Freq: Once | INTRAMUSCULAR | Status: DC

## 2021-12-15 MED ORDER — FENTANYL CITRATE (PF) 100 MCG/2ML IJ SOLN
INTRAMUSCULAR | Status: AC
Start: 2021-12-15 — End: 2021-12-16
  Filled 2021-12-15: qty 4

## 2021-12-15 MED ORDER — PANTOPRAZOLE 80MG IVPB - SIMPLE MED
80.0000 mg | Freq: Once | INTRAVENOUS | Status: AC
  Administered 2021-12-15: 80 mg via INTRAVENOUS
  Filled 2021-12-15: qty 80

## 2021-12-15 MED ORDER — ACETAMINOPHEN 325 MG PO TABS
650.0000 mg | ORAL_TABLET | Freq: Four times a day (QID) | ORAL | Status: DC | PRN
  Administered 2021-12-15: 650 mg via ORAL
  Filled 2021-12-15 (×2): qty 2

## 2021-12-15 MED ORDER — FENTANYL CITRATE (PF) 100 MCG/2ML IJ SOLN
INTRAMUSCULAR | Status: AC | PRN
  Administered 2021-12-15: 25 ug via INTRAVENOUS

## 2021-12-15 MED ORDER — PREDNISONE 20 MG PO TABS
50.0000 mg | ORAL_TABLET | Freq: Four times a day (QID) | ORAL | Status: AC
  Administered 2021-12-15: 50 mg via ORAL
  Filled 2021-12-15: qty 1

## 2021-12-15 MED ORDER — ACETAMINOPHEN 650 MG RE SUPP
650.0000 mg | Freq: Four times a day (QID) | RECTAL | Status: DC | PRN
Start: 2021-12-15 — End: 2021-12-20

## 2021-12-15 MED ORDER — ONDANSETRON HCL 4 MG PO TABS: 4.0000 mg | ORAL_TABLET | Freq: Four times a day (QID) | ORAL | Status: DC | PRN

## 2021-12-15 MED ORDER — DIPHENHYDRAMINE HCL 25 MG PO CAPS: 50.0000 mg | ORAL_CAPSULE | Freq: Once | ORAL | Status: DC

## 2021-12-15 MED ORDER — LACTATED RINGERS IV SOLN: INTRAVENOUS | Status: DC

## 2021-12-15 MED ORDER — PANTOPRAZOLE SODIUM 40 MG IV SOLR: 40.0000 mg | Freq: Two times a day (BID) | INTRAVENOUS | Status: DC

## 2021-12-15 MED ORDER — ORAL CARE MOUTH RINSE
15.0000 mL | Freq: Two times a day (BID) | OROMUCOSAL | Status: DC
  Administered 2021-12-15 – 2021-12-20 (×8): 15 mL via OROMUCOSAL

## 2021-12-15 MED ORDER — DEXTROSE-NACL 5-0.45 % IV SOLN: INTRAVENOUS | Status: DC

## 2021-12-15 MED ORDER — ONDANSETRON HCL 4 MG/2ML IJ SOLN: 4.0000 mg | Freq: Four times a day (QID) | INTRAMUSCULAR | Status: DC | PRN

## 2021-12-15 MED ORDER — IOHEXOL 300 MG/ML  SOLN
100.0000 mL | Freq: Once | INTRAMUSCULAR | Status: AC | PRN
  Administered 2021-12-15: 25 mL via INTRA_ARTERIAL

## 2021-12-15 MED ORDER — MIDAZOLAM HCL 2 MG/2ML IJ SOLN
INTRAMUSCULAR | Status: AC | PRN
  Administered 2021-12-15 (×2): .5 mg via INTRAVENOUS

## 2021-12-15 NOTE — Assessment & Plan Note (Addendum)
Pt with 2 bleeding sites identified on EGDs at [facility]: Dieulafoy lesion followed by duodenal ulcer (see HPI above). ?1. Spoke with Dr. Serafina Royals: ?1. H/o Hives to IVC dye many decades ago ?2. Getting the 13hr pre-contrast allergy prednisone and benadryl per protocol ?3. Did let him know about the AKI as well ?2. NPO ?3. IVF: LR at 125 ?4. Getting CBC/CMP stat ?5. Getting type and screen stat ?6. Given AKI, 2 different bleeding sites identified already at [facility], risk to kidneys from IVC dye: ?1. Discussed with Radiologist on call as well ?2. Going to order tagged RBC scan STAT. ?

## 2021-12-15 NOTE — Progress Notes (Signed)
?PROGRESS NOTE ? ? ? ?DOC MANDALA  OQH:[id number] DOB: [birth year] DOA: 12/15/2021 ?PCP: Bonnita Nasuti, MD  ? ? ?Brief Narrative:  ?88 year old gentleman with history of recurrent lung cancer treated with lobectomy and subsequent radiation, hypertension, stage IIIb CKD with baseline creatinine about 1.8 admitted with GI bleeding. ?Admitted to [facility] with GI bleeding and acute kidney injury.  Transferred to [facility] with evidence of ongoing GI bleeding. ? ? ?Assessment & Plan: ?  ?Acute upper GI bleeding, acute blood loss anemia. ?EGD 4/23, active bleeding from the stomach and questionable Dieulafoy lesion, clipped and cauterized ?EGD 4/25, second site of bleeding from a small duodenal ulcer in the distal duodenal bulb treated with sclerotherapy ?Colonoscopy 4/25, follow-up blood.  No bleeding points. ?Total blood transfusions-7 units PRBC and 1 unit FFP at [facility]. ?Hemoglobin 8.5-8.3 now.  Continue to monitor every 8 hours and transfuse for less than 8. ?Patient for angiogram and possible intervention if any bleeding vessels by IR today.  Keep n.p.o. with ice chips. ?Continue IV Protonix. ? ?Acute kidney injury superimposed on chronic kidney disease stage IIIb: Hypernatremia. ?Recent baseline creatinine of 1.8 in 2/23 ?Multifactorial acute kidney injury.  No evidence of uremia. ?Creatinine 2.91 today, give bolus IV fluid as patient is going for contrasted studies. ?Change Ringer lactate to 5% half-normal saline due to rising sodium and chloride. ?We will check ultrasound of the kidney if no adequate improvement on repeat testing. ?Check intake and output. ? ?Essential hypertension: Antihypertensives on hold with risk of hypotension. ? ?Hyperlipidemia: Statins on hold. ? ? ?DVT prophylaxis: SCDs Start: 12/15/21 0201 ? ? ?Code Status: Full code ?Family Communication: Wife and daughter at bedside. ?Disposition Plan: Status is: Inpatient ?Remains inpatient appropriate because: Active GI  bleeding ?  ? ? ?Consultants:  ?Interventional radiology ? ?Procedures:  ?Multiple procedures at [facility] ? ?Antimicrobials:  ?None ? ? ?Subjective: ?Patient seen and examined.  Hard of hearing.  Overnight tired but denies any nausea vomiting.  Denies any abdominal pain or distention. ?Reported last bowel movement 2 days ago. ? ?Objective: ?Vitals:  ? 12/15/21 0043 12/15/21 0339 12/15/21 0842  ?BP: (!) 123/57 (!) 137/59 (!) 146/66  ?Pulse: 89 94 92  ?Resp: 14 20 18   ?Temp: 98.1 ?F (36.7 ?C) 97.8 ?F (36.6 ?C) 97.9 ?F (36.6 ?C)  ?TempSrc: Oral Oral Oral  ?SpO2: 100% 100% 96%  ?Weight: 98.6 kg    ?Height: 6' (1.829 m)    ? ? ?Intake/Output Summary (Last 24 hours) at 12/15/2021 1047 ?Last data filed at 12/15/2021 [phone] ?Gross per 24 hour  ?Intake 563.71 ml  ?Output 625 ml  ?Net -61.29 ml  ? ?Filed Weights  ? 12/15/21 0043  ?Weight: 98.6 kg  ? ? ?Examination: ? ?General exam: Appears calm and comfortable.  Sick looking. ?On room air.  Not in any distress.  Flat affect.  Hard of hearing. ?Respiratory system: Clear to auscultation. Respiratory effort normal.  No added sounds. ?Cardiovascular system: S1 & S2 heard, RRR.  ?Gastrointestinal system: Soft.  Nontender.  Bowel sound present. ?Central nervous system: Alert and oriented. No focal neurological deficits. ?Extremities: Symmetric 5 x 5 power. ? ? ? ?Data Reviewed: I have personally reviewed following labs and imaging studies ? ?CBC: ?Recent Labs  ?Lab 12/15/21 ?0149 12/15/21 ?6812  ?WBC 16.1*  --   ?HGB 8.5* 8.3*  ?HCT 26.5* 25.6*  ?MCV 91.7  --   ?PLT 158  --   ? ?Basic Metabolic Panel: ?Recent Labs  ?Lab 12/15/21 ?  0149  ?NA 149*  ?K 4.1  ?CL 127*  ?CO2 18*  ?GLUCOSE 150*  ?BUN 108*  ?CREATININE 2.91*  ?CALCIUM 8.3*  ? ?GFR: ?Estimated Creatinine Clearance: 21.3 mL/min (A) (by C-G formula based on SCr of 2.91 mg/dL (H)). ?Liver Function Tests: ?Recent Labs  ?Lab 12/15/21 ?0149  ?AST 17  ?ALT 17  ?ALKPHOS 26*  ?BILITOT 0.4  ?PROT 4.5*  ?ALBUMIN 2.4*  ? ?No  results for input(s): LIPASE, AMYLASE in the last 168 hours. ?No results for input(s): AMMONIA in the last 168 hours. ?Coagulation Profile: ?Recent Labs  ?Lab 12/15/21 ?0149  ?INR 1.3*  ? ?Cardiac Enzymes: ?No results for input(s): CKTOTAL, CKMB, CKMBINDEX, TROPONINI in the last 168 hours. ?BNP (last 3 results) ?No results for input(s): PROBNP in the last 8760 hours. ?HbA1C: ?No results for input(s): HGBA1C in the last 72 hours. ?CBG: ?No results for input(s): GLUCAP in the last 168 hours. ?Lipid Profile: ?No results for input(s): CHOL, HDL, LDLCALC, TRIG, CHOLHDL, LDLDIRECT in the last 72 hours. ?Thyroid Function Tests: ?No results for input(s): TSH, T4TOTAL, FREET4, T3FREE, THYROIDAB in the last 72 hours. ?Anemia Panel: ?No results for input(s): VITAMINB12, FOLATE, FERRITIN, TIBC, IRON, RETICCTPCT in the last 72 hours. ?Sepsis Labs: ?No results for input(s): PROCALCITON, LATICACIDVEN in the last 168 hours. ? ?No results found for this or any previous visit (from the past 240 hour(s)).  ? ? ? ? ? ?Radiology Studies: ?No results found. ? ? ? ? ? ?Scheduled Meds: ? diphenhydrAMINE  50 mg Oral Once  ? mouth rinse  15 mL Mouth Rinse BID  ? [START ON 12/18/2021] pantoprazole  40 mg Intravenous Q12H  ? predniSONE  50 mg Oral Q6H  ? ?Continuous Infusions: ? dextrose 5 % and 0.45% NaCl 125 mL/hr at 12/15/21 0917  ? pantoprazole 8 mg/hr (12/15/21 0412)  ? ? ? LOS: 0 days  ? ? ?Time spent: Additional 30 minutes.  Same-day admission.  No charge visit. ? ? ? ?Barb Merino, MD ?[facility] ?Pager [phone] ? ?

## 2021-12-15 NOTE — Assessment & Plan Note (Signed)
Putting patient on the 12h prednisone protocol for IVC dye usage. ?

## 2021-12-15 NOTE — H&P (Signed)
?History and Physical  ? ? ?Patient: [name] ERD:[id number] DOB: Nov 01, 1930 ?DOA: 12/15/2021 ?DOS: the patient was seen and examined on 12/15/2021 ?PCP: Bonnita Nasuti, MD  ?Patient coming from: Outside Hospital ? ?Chief Complaint: GI bleed ? ? ?HPI: [name] is a 88 y.o. male with medical history significant of Lung CA, lobectomy some 20 years ago, recurrent lung CA in 2020 treated with radiation, currently stable and being seen by onc every 6 months for follow up.  HTN.  CKD 3 with baseline creat of 1.8. ? ?Pt was admitted to [facility] on 4/23 with GIB.  Also AKI with creat 2.5. ? ?EGD performed on 4/23 = active bleeding from stomach, question of Dieulafoy lesion.  Clipped and cauterized. ? ?Unfortunately pt continued to have HGB drop over the next few days despite transfusions. ? ?Repeat EGD performed on 4/25 = Clip and cautery seems to be holding without active bleeding, but pt had second site of bleeding from small duodenal ulcer in distal duodenal bulb.  Sclerotherapy done. ? ?Despite this, pt continued yet again to have ongoing hemoglobin drop from presumed ongoing GIB. ? ?Pt transferred to [facility] for IR evaluation for possible embolization at recommendation of IR. ? ?Total transfusion thus far at [facility] = 7u PRBC + 1u FFP. ? ?Last bloody BM was a couple of days ago. ? ?Yesterday had dry heaving (no bloody emesis) at [facility]. ?  ?Review of Systems: As mentioned in the history of present illness. All other systems reviewed and are negative. ?Past Medical History:  ?Diagnosis Date  ? Arthritis   ? Gastroesophageal reflux   ? Hypercholesteremia   ? Hypertension   ? Kidney stones   ? Lung cancer ([place]) 2002-2003  ? Melanoma ([place]) 2019  ? Bilateral arms  ? Obstructive sleep apnea   ? ?Past Surgical History:  ?Procedure Laterality Date  ? CHEST WALL TUMOR EXCISION Left 2003  ? EXCISIONAL HEMORRHOIDECTOMY  2019  ? LOBECTOMY Right 2002  ? Lower lobe  ? ?Social History:  reports that he has quit smoking. His smoking use  included cigarettes. He smoked an average of 1 pack per day. He has never used smokeless tobacco. He reports that he does not currently use alcohol. No history on file for drug use. ? ?Allergies  ?Allergen Reactions  ? Iodinated Contrast Media Hives  ? ? ?Family History  ?Problem Relation Age of Onset  ? Colon cancer Father   ? Lung cancer Sister   ? Breast cancer Daughter   ? ? ?Prior to Admission medications   ?Medication Sig Start Date End Date Taking? Authorizing Provider  ?aspirin EC 81 MG tablet Take by mouth.    [provider]  ?Glucosamine 500 MG CAPS 1 capsule with a meal    [provider]  ?levothyroxine (SYNTHROID) 50 MCG tablet TAKE 1 AND 1/2 TABLETS EVERY MORNING ON AN EMPTY STOMACH 07/25/18   [provider]  ?losartan-hydrochlorothiazide (HYZAAR) 100-25 MG tablet  05/13/19   [provider]  ?lovastatin (MEVACOR) 40 MG tablet  05/20/19   [provider]  ?Multiple Vitamin (MULTI-VITAMIN) tablet Take by mouth.    [provider]  ?nitroGLYCERIN (NITROSTAT) 0.4 MG SL tablet 1 tablet under the tongue and allow to dissolve as needed 03/02/16   [provider]  ?Omega-3 Fatty Acids (FISH OIL) 1200 MG CPDR 1 capsule    [provider]  ?pregabalin (LYRICA) 75 MG capsule  05/09/19   [provider]  ?spironolactone (ALDACTONE)  25 MG tablet  05/15/19   [provider]  ?tamsulosin (FLOMAX) 0.4 MG CAPS capsule Take 0.4 mg by mouth.    [provider]  ? ? ?Physical Exam: ?Vitals:  ? 12/15/21 0043  ?BP: (!) 123/57  ?Pulse: 89  ?Resp: 14  ?Temp: 98.1 ?F (36.7 ?C)  ?TempSrc: Oral  ?SpO2: 100%  ?Weight: 98.6 kg  ?Height: 6' (1.829 m)  ? ?Constitutional: NAD, calm, comfortable ?Eyes: PERRL, lids and conjunctivae normal ?ENMT: Mucous membranes are moist. Posterior pharynx clear of any exudate or lesions.Normal dentition.  ?Neck: normal, supple, no masses, no thyromegaly ?Respiratory: clear to auscultation bilaterally, no  wheezing, no crackles. Normal respiratory effort. No accessory muscle use.  ?Cardiovascular: Regular rate and rhythm, no murmurs / rubs / gallops. No extremity edema. 2+ pedal pulses. No carotid bruits.  ?Abdomen: no tenderness, no masses palpated. No hepatosplenomegaly. Bowel sounds positive.  ?Musculoskeletal: no clubbing / cyanosis. No joint deformity upper and lower extremities. Good ROM, no contractures. Normal muscle tone.  ?Skin: no rashes, lesions, ulcers. No induration ?Neurologic: CN 2-12 grossly intact. Sensation intact, DTR normal. Strength 5/5 in all 4.  ?Psychiatric: Normal judgment and insight. Alert and oriented x 3. Normal mood.  ? ?Data Reviewed: ? ?Results are pending, will review when available. ? ?Assessment and Plan: ?* UGIB (upper gastrointestinal bleed) ?Pt with 2 bleeding sites identified on EGDs at [facility]: Dieulafoy lesion followed by duodenal ulcer (see HPI above). ?Spoke with Dr. Serafina Royals: ?H/o Hives to IVC dye many decades ago ?Getting the 13hr pre-contrast allergy prednisone and benadryl per protocol ?Did let him know about the AKI as well ?NPO ?IVF: LR at 125 ?Getting CBC/CMP stat ?Getting type and screen stat ?Given AKI, 2 different bleeding sites identified already at [facility], risk to kidneys from IVC dye: ?Discussed with Radiologist on call as well ?Going to order tagged RBC scan STAT. ? ?Acute kidney injury superimposed on chronic kidney disease ([place]) ?Creat of 2.5 at [facility] today on top of a baseline of 1.8 in Feb. ?BUN of 110 today at [facility] much of BUN elevation likely secondary to GIB though. ?Having N/V with the BUN elevation, suspect platelet dysfunction contributing to GIB as well.  Not having any AMS / uremic encephalopathy at this point. ?Repeat CMP now ?IVF: LR at 125 ?Strict intake and output ? ?History of allergy to radiographic contrast media ?Putting patient on the 12h prednisone protocol for IVC dye usage. ? ?Lung cancer ([place]) ?Resection some 20 years ago ?Recurrence / new cancer in  2020: radiation therapy done. ?Apparently stable at the moment, following with oncology every 6 months. ? ? ? ? ? Advance Care Planning:   Code Status: Full Code  ? ?Consults: Dr. Serafina Royals ? ?Family Communication: Daughter at bedside ? ?Severity of Illness: ?The appropriate patient status for this patient is INPATIENT. Inpatient status is judged to be reasonable and necessary in order to provide the required intensity of service to ensure the patient's safety. The patient's presenting symptoms, physical exam findings, and initial radiographic and laboratory data in the context of their chronic comorbidities is felt to place them at high risk for further clinical deterioration. Furthermore, it is not anticipated that the patient will be medically stable for discharge from the hospital within 2 midnights of admission.  ? ?* I certify that at the point of admission it is my clinical judgment that the patient will require inpatient hospital care spanning beyond 2 midnights from the point of admission due to high intensity of  service, high risk for further deterioration and high frequency of surveillance required.* ? ?Author: ?Etta Quill., DO ?12/15/2021 2:11 AM ? ?For on call review [email or web address].  ?

## 2021-12-15 NOTE — Progress Notes (Signed)
12/15/2021 ?1540 ?Received pt from IR S/P coiling.  Pt is A&O, no C/O voiced.  CCMD called to verify pt back to floor.  R groin assessed and is WNL. Instructed pt on bed rest orders till 8PM.  Call bell in reach and family at bedside. ? ?Brion Aliment C ? ?

## 2021-12-15 NOTE — Assessment & Plan Note (Addendum)
Resection some 20 years ago ?Recurrence / new cancer in 2020: radiation therapy done. ?Apparently stable at the moment, following with oncology every 6 months. ?

## 2021-12-15 NOTE — Assessment & Plan Note (Addendum)
Creat of 2.5 at [facility] today on top of a baseline of 1.8 in Feb. ?BUN of 110 today at [facility] much of BUN elevation likely secondary to GIB though. ?Having N/V with the BUN elevation, suspect platelet dysfunction contributing to GIB as well.  Not having any AMS / uremic encephalopathy at this point. ?1. Repeat CMP now ?2. IVF: LR at 125 ?3. Strict intake and output ?

## 2021-12-15 NOTE — Sedation Documentation (Signed)
Pt transported to floor with this RN and another Therapist, sports. Hand off of care given. Groin site assessed and primary RN, Juliann Pulse, RN informed pulses doperable to the RLE. All of primary RN's questions were answered prior to this RN's departure.  ? ?

## 2021-12-15 NOTE — H&P (Signed)
Chief Complaint: Patient was seen in consultation today for embolization of gastrointestinal bleed  at the request of Lyda Perone, DO  Referring Physician(s): Lyda Perone, DO  Supervising Physician: Richarda Overlie  Patient Status: [facility] - In-pt  History of Present Illness: [name] is a 88 y.o. male with PMH significant for lung CA with lobectomy, HTN, OSA and CKD stage III. Pt was admitted to [facility] on 12/11/21 for GI bleed and AKI (creat 2.50). Pt had EDG 12/11/21 that showed active bleeding that was clipped and cauterized. Pt continued to have drop in Hgb with transfusions. Pt had repeat EDG 4/25 that did not show active bleed at previously clipped site but had second site of bleeding identified from small duodenal ulcer in distal duodenal bulb. At that time, sclerotherapy was performed. Despite interventions, pt continued to have drop in Hgb even with 7 units PRBC and 1 unit FFP. Pt was transferred to [facility] to be evaluated for possible embolization of GI bleed. Dr. Elby Showers approved procedure. Dr. Lowella Dandy to perform today after pre-procedure contrast allergy medication is complete.   Past Medical History:  Diagnosis Date   Arthritis    Gastroesophageal reflux    Hypercholesteremia    Hypertension    Kidney stones    Lung cancer ([facility]) 2002-2003   Melanoma ([facility]) 2019   Bilateral arms   Obstructive sleep apnea     Past Surgical History:  Procedure Laterality Date   CHEST WALL TUMOR EXCISION Left 2003   EXCISIONAL HEMORRHOIDECTOMY  2019   LOBECTOMY Right 2002   Lower lobe    Allergies: Iodinated contrast media  Medications: Prior to Admission medications   Medication Sig Start Date End Date Taking? Authorizing Provider  aspirin EC 81 MG tablet Take by mouth.    [provider]  Glucosamine 500 MG CAPS 1 capsule with a meal    [provider]  levothyroxine (SYNTHROID) 50 MCG tablet TAKE 1 AND 1/2 TABLETS EVERY MORNING ON AN EMPTY STOMACH 07/25/18   [provider]  losartan-hydrochlorothiazide (HYZAAR) 100-25 MG tablet  05/13/19   [provider]  lovastatin (MEVACOR) 40 MG tablet  05/20/19   [provider]  Multiple Vitamin (MULTI-VITAMIN) tablet Take by mouth.    [provider]  nitroGLYCERIN (NITROSTAT) 0.4 MG SL tablet 1 tablet under the tongue and allow to dissolve as needed 03/02/16   [provider]  Omega-3 Fatty Acids (FISH OIL) 1200 MG CPDR 1 capsule    [provider]  pregabalin (LYRICA) 75 MG capsule  05/09/19   [provider]  spironolactone (ALDACTONE) 25 MG tablet  05/15/19   [provider]  tamsulosin (FLOMAX) 0.4 MG CAPS capsule Take 0.4 mg by mouth.    [provider]     Family History  Problem Relation Age of Onset   Colon cancer Father    Lung cancer Sister    Breast cancer Daughter     Social History   Socioeconomic History   Marital status: Married    Spouse name: Not on file   Number of children: Not on file   Years of education: Not on file   Highest education level: Not on file  Occupational History   Occupation: retired    Comment: previous Chartered certified accountant  Tobacco Use   Smoking status: Former    Packs/day: 1.00    Types: Cigarettes   Smokeless tobacco: Never   Tobacco comments:    Smoked between [employer]  Substance and Sexual  Activity   Alcohol use: Not Currently   Drug use: Not on file   Sexual activity: Not on file  Other Topics Concern   Not on file  Social History Narrative   Not on file   Social Determinants of Health   Financial Resource Strain: Not on file  Food Insecurity: Not on file  Transportation Needs: Not on file  Physical Activity: Not on file  Stress: Not on file  Social Connections: Not on file     Review of Systems: A 12 point ROS discussed and pertinent positives are indicated in the HPI above.  All other systems are negative.  Review of Systems  Constitutional:  Positive for fatigue.  Negative for appetite change, chills and fever.  HENT:  Negative for nosebleeds.   Respiratory:  Positive for shortness of breath. Negative for cough.   Cardiovascular:  Negative for chest pain and leg swelling.  Gastrointestinal:  Positive for blood in stool and nausea. Negative for abdominal pain and vomiting.  Genitourinary:  Negative for hematuria.  Neurological:  Positive for dizziness and weakness. Negative for headaches.   Vital Signs: BP (!) 137/59 (BP Location: Left Arm)   Pulse 94   Temp 97.8 F (36.6 C) (Oral)   Resp 20   Ht 6' (1.829 m)   Wt 217 lb 6 oz (98.6 kg)   SpO2 100%   BMI 29.48 kg/m   Physical Exam Constitutional:      General: He is not in acute distress.    Appearance: He is ill-appearing.  HENT:     Head: Normocephalic and atraumatic.     Mouth/Throat:     Mouth: Mucous membranes are dry.     Pharynx: Oropharynx is clear.  Eyes:     Extraocular Movements: Extraocular movements intact.     Pupils: Pupils are equal, round, and reactive to light.  Cardiovascular:     Rate and Rhythm: Regular rhythm. Tachycardia present.     Pulses: Normal pulses.     Heart sounds: Normal heart sounds.  Pulmonary:     Effort: Pulmonary effort is normal. No respiratory distress.     Breath sounds: Normal breath sounds.  Abdominal:     General: Bowel sounds are normal. There is no distension.     Palpations: Abdomen is soft.     Tenderness: There is no abdominal tenderness. There is no guarding.  Musculoskeletal:     Right lower leg: No edema.     Left lower leg: No edema.  Skin:    General: Skin is warm and dry.  Neurological:     Mental Status: He is alert and oriented to person, place, and time.  Psychiatric:        Mood and Affect: Mood normal.        Behavior: Behavior normal.        Thought Content: Thought content normal.        Judgment: Judgment normal.    Imaging: No results found.  Labs:  CBC: Recent Labs    12/15/21 0149 12/15/21 0728   WBC 16.1*  --   HGB 8.5* 8.3*  HCT 26.5* 25.6*  PLT 158  --     COAGS: Recent Labs    12/15/21 0149  INR 1.3*    BMP: Recent Labs    12/15/21 0149  NA 149*  K 4.1  CL 127*  CO2 18*  GLUCOSE 150*  BUN 108*  CALCIUM 8.3*  CREATININE 2.91*  GFRNONAA 20*  LIVER FUNCTION TESTS: Recent Labs    12/15/21 0149  BILITOT 0.4  AST 17  ALT 17  ALKPHOS 26*  PROT 4.5*  ALBUMIN 2.4*    TUMOR MARKERS: No results for input(s): AFPTM, CEA, CA199, CHROMGRNA in the last 8760 hours.  Assessment and Plan: History of lung CA with lobectomy, HTN, OSA and CKD stage III. Pt was admitted to [facility] on 12/11/21 for GI bleed and AKI (creat 2.50). Pt had EDG 12/11/21 that showed active bleeding that was clipped and cauterized. Pt continued to have drop in Hgb with transfusions. Pt had repeat EDG 4/25 that did not show active bleed at previously clipped site but had second site of bleeding identified from small duodenal ulcer in distal duodenal bulb. At that time, sclerotherapy was performed. Despite interventions, pt continued to have drop in Hgb even with 7 units PRBC and 1 unit FFP. Pt was transferred to [facility] to be evaluated for possible embolization of GI bleed. Dr. Elby Showers approved procedure. Dr. Lowella Dandy to perform today after pre-procedure contrast allergy medication is complete.   Pt resting in bed with wife, granddaughter and friend at bedside.  Pt is sleepy but awakens easily to verbal stimuli. He is A&O, calm and pleasant. He is in no distress. VSS, Hgb 8.3 (8.5), INR 1.3, Creat 2.91 Pt has been started on pre-procedure contrast meds which will be complete at 1300 today.  Dr. Richarda Overlie consulted with pt and granddaughter at bedside to discuss procedure as well as risks and benefits.    The Risks and benefits of embolization were discussed with the patient and family including, but not limited to bleeding, infection, vascular injury, post operative pain, or contrast induced renal  failure.  This procedure involves the use of X-rays and because of the nature of the planned procedure, it is possible that we will have prolonged use of X-ray fluoroscopy.  Potential radiation risks to you include (but are not limited to) the following: - A slightly elevated risk for cancer several years later in life. This risk is typically less than 0.5% percent. This risk is low in comparison to the normal incidence of human cancer, which is 33% for women and 50% for men according to the American Cancer Society. - Radiation induced injury can include skin redness, resembling a rash, tissue breakdown / ulcers and hair loss (which can be temporary or permanent).   The likelihood of either of these occurring depends on the difficulty of the procedure and whether you are sensitive to radiation due to previous procedures, disease, or genetic conditions.   IF your procedure requires a prolonged use of radiation, you will be notified and given written instructions for further action.  It is your responsibility to monitor the irradiated area for the 2 weeks following the procedure and to notify your physician if you are concerned that you have suffered a radiation induced injury.    All of the patient's and family's questions were answered, patient is agreeable to proceed. Consent signed and in chart.   Thank you for this interesting consult.  I greatly enjoyed meeting DEUNDRA MEHLE and look forward to participating in their care.  A copy of this report was sent to the requesting provider on this date.  Electronically Signed: Shon Hough, NP 12/15/2021, 8:39 AM   I spent a total of 30 minutes in face to face in clinical consultation, greater than 50% of which was counseling/coordinating care for mesenteric angiogram with possible embolization.

## 2021-12-15 NOTE — Procedures (Signed)
Interventional Radiology Procedure: ? ? ?Indications: Upper GI bleed and duodenal ulcer ? ?Procedure: Mesenteric arteriogram with coil embolization of GDA ? ?Findings: No active bleeding or extravasation.  Successful coil embolization of GDA with 5 mm and 6 mm Interlock and IDC coils.  Right groin sheath removed with Exoseal device. See full report in IMAGING ? ?Complications: No immediate complications noted. ?    ?EBL: Minimal ? ?Plan: Bedrest 4 hours.  Follow CBC. ? ? ?Kayanna Mckillop R. Anselm Pancoast, MD  ?Pager: [phone] ? ? ? ?  ?

## 2021-12-16 DIAGNOSIS — K922 Gastrointestinal hemorrhage, unspecified: Secondary | ICD-10-CM | POA: Diagnosis not present

## 2021-12-16 LAB — BASIC METABOLIC PANEL
Anion gap: 3 — ABNORMAL LOW (ref 5–15)
BUN: 93 mg/dL — ABNORMAL HIGH (ref 8–23)
CO2: 19 mmol/L — ABNORMAL LOW (ref 22–32)
Calcium: 8.1 mg/dL — ABNORMAL LOW (ref 8.9–10.3)
Chloride: 123 mmol/L — ABNORMAL HIGH (ref 98–111)
Creatinine, Ser: 2.58 mg/dL — ABNORMAL HIGH (ref 0.61–1.24)
GFR, Estimated: 23 mL/min — ABNORMAL LOW (ref >60)
Glucose, Bld: 177 mg/dL — ABNORMAL HIGH (ref 70–99)
Potassium: 3.9 mmol/L (ref 3.5–5.1)
Sodium: 145 mmol/L (ref 135–145)

## 2021-12-16 LAB — HEMOGLOBIN AND HEMATOCRIT, BLOOD
HCT: 24.7 % — ABNORMAL LOW (ref 39.0–52.0)
Hemoglobin: 8 g/dL — ABNORMAL LOW (ref 13.0–17.0)

## 2021-12-16 MED ORDER — PANTOPRAZOLE SODIUM 40 MG PO TBEC
40.0000 mg | DELAYED_RELEASE_TABLET | Freq: Two times a day (BID) | ORAL | Status: DC
  Administered 2021-12-16 – 2021-12-20 (×9): 40 mg via ORAL
  Filled 2021-12-16 (×9): qty 1

## 2021-12-16 NOTE — Evaluation (Signed)
Physical Therapy Evaluation ?Patient Details ?Name: [name] ?MRN: [id number] ?DOB: May 08, 1931 ?Today's Date: 12/16/2021 ? ?History of Present Illness ? Pt is an 88 y.o. male who presented 12/15/21 for embolization of GI bleed. PMH significant for lung CA with lobectomy, HTN, OSA and CKD stage III ?  ?Clinical Impression ? Pt presents with condition above and deficits mentioned below, see PT Problem List. PTA, he was IND without DME, living with his wife in a 1-level house with 2-3 STE. Currently, pt is primarily limited by feeling lightheaded each time he stands up, see vitals below. Pt also limited by deficits in activity tolerance, balance, and lower extremity strength. He required min guard - light minA for transfers to stand and minA to take steps without UE support but only min guard assist for safety when ambulating bedroom distances using a RW. Limited gait distance secondary to pt being lightheaded. He is at risk for falls, primarily secondary to his lightheadedness. Thus, encouraged pt and his wife to have a friend or family member assist them initially at d/c until his symptoms and balance improve. They verbalized understanding. I suspect pt will progress well once his lightheadedness resolves. Will continue to follow acutely. Recommending HHPT at d/c to maximize his return to baseline. ? ?SpO2 varying in 80-90s% with poor waveforms on RA, DOE 3/4 ? ?BP:  ?164/65 sitting after first standing bout ?143/69 standing second bout ?160/70 sitting after standing ?135/61 sitting after longer gait bout ? ?*lightheadedness reported each time he stood up and recovered with sitting  ?   ? ?Recommendations for follow up therapy are one component of a multi-disciplinary discharge planning process, led by the attending physician.  Recommendations may be updated based on patient status, additional functional criteria and insurance authorization. ? ?Follow Up Recommendations Home health PT ? ?  ?Assistance Recommended  at Discharge Intermittent Supervision/Assistance  ?Patient can return home with the following ? A little help with walking and/or transfers;A little help with bathing/dressing/bathroom;Assistance with cooking/housework;Assist for transportation;Help with stairs or ramp for entrance ? ?  ?Equipment Recommendations Rollator (4 wheels)  ?Recommendations for Other Services ?    ?  ?Functional Status Assessment Patient has had a recent decline in their functional status and demonstrates the ability to make significant improvements in function in a reasonable and predictable amount of time.  ? ?  ?Precautions / Restrictions Precautions ?Precautions: Fall;Other (comment) ?Precaution Comments: watch SpO2 and BP ?Restrictions ?Weight Bearing Restrictions: No  ? ?  ? ?Mobility ? Bed Mobility ?Overal bed mobility: Needs Assistance ?Bed Mobility: Supine to Sit ?  ?  ?Supine to sit: Min guard, HOB elevated ?  ?  ?General bed mobility comments: Extra time and use of bed rail to ascend trunk, HOB elevated, min guard for safety. ?  ? ?Transfers ?Overall transfer level: Needs assistance ?Equipment used: Rolling walker (2 wheels), None (or 1 hand on bed rail) ?Transfers: Sit to/from Stand, Bed to chair/wheelchair/BSC ?Sit to Stand: Min guard, Min assist ?  ?Step pivot transfers: Min assist ?  ?  ?  ?General transfer comment: Pt coming to stand 2x from EOB with R hand on bed rail for support throughout, min guard assist and extra time and effort to come to stand. Pt transferring to stand 1x from recliner to RW, cues for hand placement, needing light minA to power up to stand. MinA with stand step to L bed > recliner without UE support, unsteadiness noted but no LOB. ?  ? ?Ambulation/Gait ?Ambulation/Gait assistance:  Min guard, Min assist ?Gait Distance (Feet): 20 Feet (x2 bouts of ~4 ft > ~20 ft) ?Assistive device: Rolling walker (2 wheels), None ?Gait Pattern/deviations: Step-through pattern, Decreased stride length, Trunk  flexed ?Gait velocity: reduced ?Gait velocity interpretation: <1.31 ft/sec, indicative of household ambulator ?  ?General Gait Details: Initial short bout, pt was taking steps bed > recliner without UE support, needing minA for stability. Second longer bout, pt ambulating with short, slow steps with RW, no LOB, min guard assist for safety ? ?Stairs ?  ?  ?  ?  ?  ? ?Wheelchair Mobility ?  ? ?Modified Rankin (Stroke Patients Only) ?  ? ?  ? ?Balance Overall balance assessment: Needs assistance ?Sitting-balance support: No upper extremity supported, Feet supported ?Sitting balance-Leahy Scale: Fair ?Sitting balance - Comments: Static sitting EOB with supervision for safety ?  ?Standing balance support: No upper extremity supported, Bilateral upper extremity supported, During functional activity ?Standing balance-Leahy Scale: Poor ?Standing balance comment: Needs minA for standing without UE support, min guard assist when using RW ?  ?  ?  ?  ?  ?  ?  ?  ?  ?  ?  ?   ? ? ? ?Pertinent Vitals/Pain Pain Assessment ?Pain Assessment: Faces ?Faces Pain Scale: Hurts a little bit ?Pain Location: L upper abdomen ?Pain Descriptors / Indicators: Discomfort, Grimacing, Guarding ?Pain Intervention(s): Limited activity within patient's tolerance, Monitored during session, Repositioned  ? ? ?Home Living Family/patient expects to be discharged to:: Private residence ?Living Arrangements: Spouse/significant other ?Available Help at Discharge: Family;Available 24 hours/day (they reported they could have a friend or family come assist short term at d/c also) ?Type of Home: House ?Home Access: Stairs to enter ?Entrance Stairs-Rails: Right (ascending) ?Entrance Stairs-Number of Steps: 2-3 ?  ?Home Layout: One level ?Home Equipment: Cane - quad ?   ?  ?Prior Function Prior Level of Function : Independent/Modified Independent;Driving ?  ?  ?  ?  ?  ?  ?Mobility Comments: Does not use AD. No recent falls. Does not walk long distances due to  endurance deficits. ?ADLs Comments: No cognitive deficits, just HOH. Does not do gorcery shopping secondary to endurance deficits. ?  ? ? ?Hand Dominance  ?   ? ?  ?Extremity/Trunk Assessment  ? Upper Extremity Assessment ?Upper Extremity Assessment: Defer to OT evaluation ?  ? ?Lower Extremity Assessment ?Lower Extremity Assessment: Generalized weakness ?  ? ?Cervical / Trunk Assessment ?Cervical / Trunk Assessment: Normal  ?Communication  ? Communication: HOH  ?Cognition Arousal/Alertness: Awake/alert ?Behavior During Therapy: [facility] for tasks assessed/performed ?Overall Cognitive Status: Within Functional Limits for tasks assessed ?  ?  ?  ?  ?  ?  ?  ?  ?  ?  ?  ?  ?  ?  ?  ?  ?General Comments: Aware of his deficits. Likes to joke around, very pleasant. ?  ?  ? ?  ?General Comments General comments (skin integrity, edema, etc.): SpO2 varying in 80-90s% with poor waveforms on RA, DOE 3/4, BP 164/65 sitting after first standing bout, 143/69 standing second bout, 160/70 sitting after standing, 135/61 sitting after longer gait bout; lightheadedness reported each time he stood up and recovered with sitting; educated pt and wife on improving upright tolerance and to recline and notify RN if become more symptomatic, they verbalized understanding ? ?  ?Exercises    ? ?Assessment/Plan  ?  ?PT Assessment Patient needs continued PT services  ?PT Problem List Decreased strength;Decreased activity  tolerance;Decreased balance;Decreased mobility;Decreased knowledge of use of DME;Cardiopulmonary status limiting activity;Pain ? ?   ?  ?PT Treatment Interventions DME instruction;Gait training;Stair training;Functional mobility training;Therapeutic activities;Therapeutic exercise;Balance training;Neuromuscular re-education;Patient/family education   ? ?PT Goals (Current goals can be found in the Care Plan section)  ?Acute Rehab PT Goals ?Patient Stated Goal: to go home ?PT Goal Formulation: With patient/family ?Time For Goal  Achievement: 12/30/21 ?Potential to Achieve Goals: Good ? ?  ?Frequency Min 3X/week ?  ? ? ?Co-evaluation   ?  ?  ?  ?  ? ? ?  ?AM-PAC PT "6 Clicks" Mobility  ?Outcome Measure Help needed turning from your back to your

## 2021-12-16 NOTE — Progress Notes (Signed)
?PROGRESS NOTE ? ? ? ?DEACON GADBOIS  DQQ:[id number] DOB: 03-07-1931 DOA: 12/15/2021 ?PCP: Bonnita Nasuti, MD  ? ? ?Brief Narrative:  ?88 year old gentleman with history of recurrent lung cancer treated with lobectomy and subsequent radiation, hypertension, stage IIIb CKD with baseline creatinine about 1.8 admitted with GI bleeding. ?Admitted to [facility] with GI bleeding and acute kidney injury.  Transferred to [facility] with evidence of ongoing GI bleeding. ? ? ?Assessment & Plan: ?  ?Acute upper GI bleeding, acute blood loss anemia: ?EGD 4/23, active bleeding from the stomach and questionable Dieulafoy lesion, clipped and cauterized ?EGD 4/25, second site of bleeding from a small duodenal ulcer in the distal duodenal bulb treated with sclerotherapy ?Colonoscopy 4/25, full of blood. No bleeding points. ?Total blood transfusions-7 units PRBC and 1 unit FFP at [facility]. ?Hemoglobin 8.5-8.3-8now.  Continue to monitor every 12 hours and transfuse for less than 8. ?Angiogram and gastroduodenal artery embolization by IR 4/27. ?No evidence of ongoing GI bleeding today. ?Patient has been on IV Protonix for 7 days, changed to oral Protonix twice daily. ?Allow regular diet and mobilize. ? ?Acute kidney injury superimposed on chronic kidney disease stage IIIb: Hypernatremia. ?Recent baseline creatinine of 1.8 in 2/23 ?Multifactorial acute kidney injury.  No evidence of uremia. ?Creatinine 2.91-2.58.  Improving.  Continue dextrose half-normal saline today. ?Urine output is adequate.  Continue to monitor output. ? ?Essential hypertension: Antihypertensives on hold with risk of hypotension. ? ?Hyperlipidemia: Statins on hold. ? ?Deconditioning/debility: Work with PT OT.  Will depend upon his mobility, decide safe discharge plans. ? ? ?DVT prophylaxis: SCDs Start: 12/15/21 0201 ? ? ?Code Status: Full code ?Family Communication: Wife at the bedside. ?Disposition Plan: Status is: Inpatient ?Remains inpatient  appropriate because: Active GI bleeding.  On active treatment. ?  ? ? ?Consultants:  ?Interventional radiology ? ?Procedures:  ?Multiple procedures at [facility] ? ?Antimicrobials:  ?None ? ? ?Subjective: ? ?Seen and examined.  Trying to work with therapist.  Getting out of bed first time after 7 days feels dizzy.  No orthostatic drop.  Denies any nausea vomiting.  No BM yet in the hospital.  Hemoglobin is stable. ? ?Objective: ?Vitals:  ? 12/15/21 2000 12/15/21 2333 12/16/21 0329 12/16/21 0845  ?BP: (!) 148/65 (!) 117/48 (!) 142/62 (!) 136/57  ?Pulse: 95 72 75 85  ?Resp: 15 19 15 20   ?Temp: 98 ?F (36.7 ?C) 98 ?F (36.7 ?C) 97.7 ?F (36.5 ?C) 97.6 ?F (36.4 ?C)  ?TempSrc: Oral Axillary Oral Oral  ?SpO2: 100% 99% 100% 100%  ?Weight:      ?Height:      ? ? ?Intake/Output Summary (Last 24 hours) at 12/16/2021 1133 ?Last data filed at 12/16/2021 [id number] ?Gross per 24 hour  ?Intake 2737.57 ml  ?Output 1650 ml  ?Net 1087.57 ml  ? ?Filed Weights  ? 12/15/21 0043  ?Weight: 98.6 kg  ? ? ?Examination: ? ?General exam: Appears calm and comfortable.  Frail.  On room air.  Not in any distress.  Flat affect.  Hard of hearing. ?Respiratory system: Clear to auscultation. Respiratory effort normal.  No added sounds. ?Cardiovascular system: S1 & S2 heard, RRR.  ?Gastrointestinal system: Soft.  Nontender.  Bowel sound present. ?Central nervous system: Alert and oriented. No focal neurological deficits. ?Extremities: Symmetric 5 x 5 power. ? ? ? ?Data Reviewed: I have personally reviewed following labs and imaging studies ? ?CBC: ?Recent Labs  ?Lab 12/15/21 ?0149 12/15/21 ?7408 12/15/21 ?1702 12/16/21 ?1448  ?WBC 16.1*  --   --   --   ?  HGB 8.5* 8.3* 8.3* 8.0*  ?HCT 26.5* 25.6* 26.0* 24.7*  ?MCV 91.7  --   --   --   ?PLT 158  --   --   --   ? ?Basic Metabolic Panel: ?Recent Labs  ?Lab 12/15/21 ?0149 12/16/21 ?0333  ?NA 149* 145  ?K 4.1 3.9  ?CL 127* 123*  ?CO2 18* 19*  ?GLUCOSE 150* 177*  ?BUN 108* 93*  ?CREATININE 2.91* 2.58*  ?CALCIUM  8.3* 8.1*  ? ?GFR: ?Estimated Creatinine Clearance: 24.1 mL/min (A) (by C-G formula based on SCr of 2.58 mg/dL (H)). ?Liver Function Tests: ?Recent Labs  ?Lab 12/15/21 ?0149  ?AST 17  ?ALT 17  ?ALKPHOS 26*  ?BILITOT 0.4  ?PROT 4.5*  ?ALBUMIN 2.4*  ? ?No results for input(s): LIPASE, AMYLASE in the last 168 hours. ?No results for input(s): AMMONIA in the last 168 hours. ?Coagulation Profile: ?Recent Labs  ?Lab 12/15/21 ?0149  ?INR 1.3*  ? ?Cardiac Enzymes: ?No results for input(s): CKTOTAL, CKMB, CKMBINDEX, TROPONINI in the last 168 hours. ?BNP (last 3 results) ?No results for input(s): PROBNP in the last 8760 hours. ?HbA1C: ?No results for input(s): HGBA1C in the last 72 hours. ?CBG: ?No results for input(s): GLUCAP in the last 168 hours. ?Lipid Profile: ?No results for input(s): CHOL, HDL, LDLCALC, TRIG, CHOLHDL, LDLDIRECT in the last 72 hours. ?Thyroid Function Tests: ?No results for input(s): TSH, T4TOTAL, FREET4, T3FREE, THYROIDAB in the last 72 hours. ?Anemia Panel: ?No results for input(s): VITAMINB12, FOLATE, FERRITIN, TIBC, IRON, RETICCTPCT in the last 72 hours. ?Sepsis Labs: ?No results for input(s): PROCALCITON, LATICACIDVEN in the last 168 hours. ? ?No results found for this or any previous visit (from the past 240 hour(s)).  ? ? ? ? ? ?Radiology Studies: ?IR Angiogram Visceral Selective ? ?Result Date: 12/15/2021 ?INDICATION: 88 year old with upper GI bleed. Patient had recent endoscopic treatments for a stomach lesion and duodenal bulb ulcer. There is concern for ongoing bleeding at the duodenal bulb and patient was transferred to [facility] for embolization. Patient has acute kidney injury and history of contrast allergy. Patient was given 13 hour premedications for the contrast allergy. Plan for empiric embolization of the gastroduodenal artery based on the endoscopic findings and inability to perform significant diagnostic angiography due to patient's poor renal function. EXAM: 1. Mesenteric  arteriogram 2. Coil embolization of gastroduodenal artery 3. Ultrasound guidance for vascular access MEDICATIONS: Moderate sedation ANESTHESIA/SEDATION: Moderate (conscious) sedation was employed during this procedure. A total of Versed 1.0 mg and Fentanyl 25 mcg was administered intravenously by the radiology nurse. Total intra-service moderate Sedation Time: 54 minutes. The patient's level of consciousness and vital signs were monitored continuously by radiology nursing throughout the procedure under my direct supervision. CONTRAST:  25 mL Omnipaque 300 FLUOROSCOPY: Radiation Exposure Index (as provided by the fluoroscopic device): 884 mGy Kerma COMPLICATIONS: None immediate. PROCEDURE: Informed consent was obtained from the patient following explanation of the procedure, risks, benefits and alternatives. The patient and family understands, agrees and consents for the procedure. All questions were addressed. A time out was performed prior to the initiation of the procedure. Patient was placed supine on the interventional table. Ultrasound confirmed a patent right common femoral artery. Ultrasound image was saved for documentation. Right groin was prepped and draped in sterile fashion. Maximal barrier sterile technique was utilized including caps, mask, sterile gowns, sterile gloves, sterile drape, hand hygiene and skin antiseptic. Right groin was anesthetized using 1% lidocaine. Small incision was made. Using ultrasound guidance, 21  gauge needle was directed in the right common femoral artery and micropuncture dilator set was placed. Five French vascular sheath was placed. C2 catheter was used to cannulate the celiac artery. Contrast injection was performed in the celiac artery. STC Renegade microcatheter was advanced into the common hepatic artery and angiography was performed. The microcatheter was advanced into the gastroduodenal artery and additional angiography was performed. The catheter was placed in the  distal gastroduodenal artery and coil embolization was performed using combination of 5 mm and 6 mm Music therapist and IDC coils. Follow-up angiograms confirmed occlusion of the gastroduodenal a

## 2021-12-16 NOTE — Progress Notes (Signed)
? ? ?Referring Physician(s): Jennette Kettle, DO ? ?Supervising Physician: Jacqulynn Cadet ? ?Patient Status:  [facility] - In-pt ? ?Chief Complaint: Follow up mesenteric angiogram with coil embolization of GDA 12/15/21 in IR ? ?Subjective: ? ?Patient sitting in chair, wife and RN at bedside. Patient HOH, denies any complaints, wants to get back into bed. Per wife no further bleeding noted.  ? ?Allergies: ?Iodinated contrast media ? ?Medications: ?Prior to Admission medications   ?Medication Sig Start Date End Date Taking? Authorizing Provider  ?aspirin EC 81 MG tablet Take 81 mg by mouth daily.   Yes [provider]  ?Glucosamine 500 MG CAPS Take 500 mg by mouth daily.   Yes [provider]  ?levothyroxine (SYNTHROID) 50 MCG tablet Take 75 mcg by mouth daily before breakfast. Taking 1 & 1/2 tablets daily 07/25/18  Yes [provider]  ?losartan-hydrochlorothiazide (HYZAAR) 100-25 MG tablet Take 1 tablet by mouth daily. 05/13/19  Yes [provider]  ?lovastatin (MEVACOR) 40 MG tablet Take 40 mg by mouth at bedtime. 05/20/19  Yes [provider]  ?Multiple Vitamin (MULTI-VITAMIN) tablet Take 1 tablet by mouth daily.   Yes [provider]  ?nitroGLYCERIN (NITROSTAT) 0.4 MG SL tablet Place 0.4 mg under the tongue every 5 (five) minutes as needed for chest pain. 03/02/16  Yes [provider]  ?Omega-3 Fatty Acids (FISH OIL) 1200 MG CPDR Take 1,200 mg by mouth daily.   Yes [provider]  ?polyethylene glycol (MIRALAX / GLYCOLAX) 17 g packet Take 17 g by mouth daily.   Yes [provider]  ?pregabalin (LYRICA) 75 MG capsule Take 75 mg by mouth 2 (two) times daily. 05/09/19  Yes [provider]  ?spironolactone (ALDACTONE) 25 MG tablet Take 37.5 mg by mouth daily. Taking 1 & 1/2 tablet(s) daily 05/15/19  Yes [provider]  ?tamsulosin (FLOMAX) 0.4 MG CAPS capsule Take 0.8 mg by mouth daily.   Yes [provider]   ? ? ? ?Vital Signs: ?BP (!) 136/57 (BP Location: Left Arm)   Pulse 85   Temp 97.6 ?F (36.4 ?C) (Oral)   Resp 20   Ht 6' (1.829 m)   Wt 217 lb 6 oz (98.6 kg)   SpO2 100%   BMI 29.48 kg/m?  ? ?Physical Exam ?Vitals and nursing note reviewed.  ?Constitutional:   ?   General: He is not in acute distress. ?HENT:  ?   Head: Normocephalic.  ?Cardiovascular:  ?   Rate and Rhythm: Normal rate.  ?   Comments: (+) right femoral artery site clean, dry, dressed appropriately. Soft, non-tender, non-pulsatile. No active bleeding or drainage. ?Pulmonary:  ?   Effort: Pulmonary effort is normal.  ?Abdominal:  ?   General: There is no distension.  ?   Palpations: Abdomen is soft.  ?   Tenderness: There is no abdominal tenderness.  ?Skin: ?   General: Skin is warm and dry.  ?Neurological:  ?   Mental Status: He is alert. Mental status is at baseline.  ? ? ?Imaging: ?IR Angiogram Visceral Selective ? ?Result Date: 12/15/2021 ?INDICATION: 88 year old with upper GI bleed. Patient had recent endoscopic treatments for a stomach lesion and duodenal bulb ulcer. There is concern for ongoing bleeding at the duodenal bulb and patient was transferred to [facility] for embolization. Patient has acute kidney injury and history of contrast allergy. Patient was given 13 hour premedications for the contrast allergy. Plan for empiric embolization of the gastroduodenal artery based on the  endoscopic findings and inability to perform significant diagnostic angiography due to patient's poor renal function. EXAM: 1. Mesenteric arteriogram 2. Coil embolization of gastroduodenal artery 3. Ultrasound guidance for vascular access MEDICATIONS: Moderate sedation ANESTHESIA/SEDATION: Moderate (conscious) sedation was employed during this procedure. A total of Versed 1.0 mg and Fentanyl 25 mcg was administered intravenously by the radiology nurse. Total intra-service moderate Sedation Time: 54 minutes. The patient's level of consciousness and  vital signs were monitored continuously by radiology nursing throughout the procedure under my direct supervision. CONTRAST:  25 mL Omnipaque 300 FLUOROSCOPY: Radiation Exposure Index (as provided by the fluoroscopic device): 371 mGy Kerma COMPLICATIONS: None immediate. PROCEDURE: Informed consent was obtained from the patient following explanation of the procedure, risks, benefits and alternatives. The patient and family understands, agrees and consents for the procedure. All questions were addressed. A time out was performed prior to the initiation of the procedure. Patient was placed supine on the interventional table. Ultrasound confirmed a patent right common femoral artery. Ultrasound image was saved for documentation. Right groin was prepped and draped in sterile fashion. Maximal barrier sterile technique was utilized including caps, mask, sterile gowns, sterile gloves, sterile drape, hand hygiene and skin antiseptic. Right groin was anesthetized using 1% lidocaine. Small incision was made. Using ultrasound guidance, 21 gauge needle was directed in the right common femoral artery and micropuncture dilator set was placed. Five French vascular sheath was placed. C2 catheter was used to cannulate the celiac artery. Contrast injection was performed in the celiac artery. STC Renegade microcatheter was advanced into the common hepatic artery and angiography was performed. The microcatheter was advanced into the gastroduodenal artery and additional angiography was performed. The catheter was placed in the distal gastroduodenal artery and coil embolization was performed using combination of 5 mm and 6 mm Music therapist and IDC coils. Follow-up angiograms confirmed occlusion of the gastroduodenal artery. Microcatheter and C2 catheter were removed. Angiogram was performed through the right groin sheath. Right groin sheath was removed using the ExoSeal closure device. Right groin hemostasis. Bandage placed  over the puncture site. FINDINGS: Celiac artery was patent. Gastroduodenal artery was widely patent. Limited diagnostic angiography was performed due to patient's poor renal function. However, there was no gross active GI bleeding or contrast extravasation. Coil embolization was started in the distal GDA and extended into the proximal GDA just beyond the origin. No antegrade filling of the GDA at the end of the procedure. IMPRESSION: Successful coil embolization of the gastroduodenal artery. Electronically Signed   By: Markus Daft M.D.   On: 12/15/2021 17:19  ? ?IR Angiogram Selective Each Additional Vessel ? ?Result Date: 12/15/2021 ?INDICATION: 88 year old with upper GI bleed. Patient had recent endoscopic treatments for a stomach lesion and duodenal bulb ulcer. There is concern for ongoing bleeding at the duodenal bulb and patient was transferred to [facility] for embolization. Patient has acute kidney injury and history of contrast allergy. Patient was given 13 hour premedications for the contrast allergy. Plan for empiric embolization of the gastroduodenal artery based on the endoscopic findings and inability to perform significant diagnostic angiography due to patient's poor renal function. EXAM: 1. Mesenteric arteriogram 2. Coil embolization of gastroduodenal artery 3. Ultrasound guidance for vascular access MEDICATIONS: Moderate sedation ANESTHESIA/SEDATION: Moderate (conscious) sedation was employed during this procedure. A total of Versed 1.0 mg and Fentanyl 25 mcg was administered intravenously by the radiology nurse. Total intra-service moderate Sedation Time: 54 minutes. The patient's level of consciousness and vital signs were monitored  continuously by radiology nursing throughout the procedure under my direct supervision. CONTRAST:  25 mL Omnipaque 300 FLUOROSCOPY: Radiation Exposure Index (as provided by the fluoroscopic device): 021 mGy Kerma COMPLICATIONS: None immediate. PROCEDURE: Informed  consent was obtained from the patient following explanation of the procedure, risks, benefits and alternatives. The patient and family understands, agrees and consents for the procedure. All questions were addressed. A ti

## 2021-12-17 ENCOUNTER — Other Ambulatory Visit: Payer: Self-pay

## 2021-12-17 DIAGNOSIS — K922 Gastrointestinal hemorrhage, unspecified: Secondary | ICD-10-CM | POA: Diagnosis not present

## 2021-12-17 LAB — CBC WITH DIFFERENTIAL/PLATELET
Abs Immature Granulocytes: 0.73 10*3/uL — ABNORMAL HIGH (ref 0.00–0.07)
Abs Immature Granulocytes: 0.73 10*3/uL — ABNORMAL HIGH (ref 0.00–0.07)
Basophils Absolute: 0 10*3/uL (ref 0.0–0.1)
Basophils Absolute: 0 10*3/uL (ref 0.0–0.1)
Basophils Relative: 0 %
Basophils Relative: 0 %
Eosinophils Absolute: 0.1 10*3/uL (ref 0.0–0.5)
Eosinophils Absolute: 0.1 10*3/uL (ref 0.0–0.5)
Eosinophils Relative: 1 %
Eosinophils Relative: 1 %
HCT: 24.6 % — ABNORMAL LOW (ref 39.0–52.0)
HCT: 26.5 % — ABNORMAL LOW (ref 39.0–52.0)
Hemoglobin: 7.9 g/dL — ABNORMAL LOW (ref 13.0–17.0)
Hemoglobin: 8.7 g/dL — ABNORMAL LOW (ref 13.0–17.0)
Immature Granulocytes: 6 %
Immature Granulocytes: 5 %
Lymphocytes Relative: 9 %
Lymphocytes Relative: 7 %
Lymphs Abs: 1.1 10*3/uL (ref 0.7–4.0)
Lymphs Abs: 0.9 10*3/uL (ref 0.7–4.0)
MCH: 29.6 pg (ref 26.0–34.0)
MCH: 30.9 pg (ref 26.0–34.0)
MCHC: 32.1 g/dL (ref 30.0–36.0)
MCHC: 32.8 g/dL (ref 30.0–36.0)
MCV: 92.1 fL (ref 80.0–100.0)
MCV: 94 fL (ref 80.0–100.0)
Monocytes Absolute: 1.1 10*3/uL — ABNORMAL HIGH (ref 0.1–1.0)
Monocytes Absolute: 1.1 10*3/uL — ABNORMAL HIGH (ref 0.1–1.0)
Monocytes Relative: 9 %
Monocytes Relative: 8 %
Neutro Abs: 9.3 10*3/uL — ABNORMAL HIGH (ref 1.7–7.7)
Neutro Abs: 10.7 10*3/uL — ABNORMAL HIGH (ref 1.7–7.7)
Neutrophils Relative %: 75 %
Neutrophils Relative %: 79 %
Platelets: 143 10*3/uL — ABNORMAL LOW (ref 150–400)
Platelets: 150 10*3/uL (ref 150–400)
RBC: 2.67 MIL/uL — ABNORMAL LOW (ref 4.22–5.81)
RBC: 2.82 MIL/uL — ABNORMAL LOW (ref 4.22–5.81)
RDW: 19.8 % — ABNORMAL HIGH (ref 11.5–15.5)
RDW: 20.2 % — ABNORMAL HIGH (ref 11.5–15.5)
Smear Review: NORMAL
WBC: 12.4 10*3/uL — ABNORMAL HIGH (ref 4.0–10.5)
WBC: 13.6 10*3/uL — ABNORMAL HIGH (ref 4.0–10.5)
nRBC: 0.6 % — ABNORMAL HIGH (ref 0.0–0.2)
nRBC: 0.4 % — ABNORMAL HIGH (ref 0.0–0.2)

## 2021-12-17 LAB — BASIC METABOLIC PANEL
Anion gap: 1 — ABNORMAL LOW (ref 5–15)
BUN: 71 mg/dL — ABNORMAL HIGH (ref 8–23)
CO2: 19 mmol/L — ABNORMAL LOW (ref 22–32)
Calcium: 8 mg/dL — ABNORMAL LOW (ref 8.9–10.3)
Chloride: 125 mmol/L — ABNORMAL HIGH (ref 98–111)
Creatinine, Ser: 2.16 mg/dL — ABNORMAL HIGH (ref 0.61–1.24)
GFR, Estimated: 29 mL/min — ABNORMAL LOW (ref >60)
Glucose, Bld: 139 mg/dL — ABNORMAL HIGH (ref 70–99)
Potassium: 3.6 mmol/L (ref 3.5–5.1)
Sodium: 145 mmol/L (ref 135–145)

## 2021-12-17 MED ORDER — POLYETHYLENE GLYCOL 3350 17 G PO PACK
17.0000 g | PACK | Freq: Every day | ORAL | Status: DC
  Administered 2021-12-17 – 2021-12-20 (×4): 17 g via ORAL
  Filled 2021-12-17 (×4): qty 1

## 2021-12-17 MED ORDER — TAMSULOSIN HCL 0.4 MG PO CAPS
0.8000 mg | ORAL_CAPSULE | Freq: Every day | ORAL | Status: DC
  Administered 2021-12-17 – 2021-12-20 (×4): 0.8 mg via ORAL
  Filled 2021-12-17 (×4): qty 2

## 2021-12-17 NOTE — Progress Notes (Signed)
?PROGRESS NOTE ? ? ? ?[name]  TZG:[id number] DOB: 1930/10/16 DOA: 12/15/2021 ?PCP: Bonnita Nasuti, MD  ? ? ?Brief Narrative:  ?88 year old gentleman with history of recurrent lung cancer treated with lobectomy and subsequent radiation, hypertension, stage IIIb CKD with baseline creatinine about 1.8 admitted with GI bleeding. ?Admitted to [facility] with GI bleeding and acute kidney injury.  Transferred to [facility] with evidence of ongoing GI bleeding. ? ? ?Assessment & Plan: ?  ?Acute upper GI bleeding, acute blood loss anemia: ?EGD 4/23, active bleeding from the stomach and questionable Dieulafoy lesion, clipped and cauterized ?EGD 4/25, second site of bleeding from a small duodenal ulcer in the distal duodenal bulb treated with sclerotherapy ?Colonoscopy 4/25, full of blood. No bleeding points. ?Total blood transfusions-7 units PRBC and 1 unit FFP at [facility]. ?Hemoglobin 8.5-8.3-8-7.9 now.  Recheck tomorrow morning.  No clinical evidence of ongoing bleeding.   ?Angiogram and gastroduodenal artery embolization by IR 4/27. ?Oral Protonix now and on discharge. ?Allow regular diet and mobilize.  Laxatives to help with bowel movement today. ? ?Acute kidney injury superimposed on chronic kidney disease stage IIIb: Hypernatremia. ?Recent baseline creatinine of 1.8 in 2/23 ?Multifactorial acute kidney injury.  No evidence of uremia. ?Creatinine 2.91-2.58-2.1.  Improving.   ?Discontinue IV fluid.  Encourage oral intake. Urine output is adequate.  Continue to monitor output. ? ?Essential hypertension: Antihypertensives on hold with risk of hypotension. ? ?Hyperlipidemia: Statins on hold. ? ?BPH: Resume Flomax.  He will tolerate. ? ?Right arm IV infiltration: Uncomplicated.  Ice pack. ? ?Deconditioning/debility: Work with PT OT.  Will depend upon his mobility, decide safe discharge plans. ? ? ?DVT prophylaxis: SCDs Start: 12/15/21 0201 ? ? ?Code Status: Full code ?Family Communication: Wife at the  bedside and granddaughter on the phone. ?Disposition Plan: Status is: Inpatient ?Remains inpatient appropriate because: Active GI bleeding.  On active treatment. ?  ?Mobilize.  Laxative.  Anticipate home tomorrow with home health therapies. ? ? ?Consultants:  ?Interventional radiology ? ?Procedures:  ?Multiple procedures at [facility] ? ?Antimicrobials:  ?None ? ? ?Subjective: ? ?Seen and examined.  Trying to eat some breakfast.  Denies any nausea vomiting abdominal pain.  No bowel movement for last 5 days.  Denies any abdominal pain or bloating.  Wife at the bedside.  Granddaughter on the phone. ?Was able to get out of the bed slightly better than yesterday but still feels very weak and tired. ? ?Objective: ?Vitals:  ? 12/16/21 1151 12/16/21 2017 12/17/21 0323 12/17/21 7591  ?BP: (!) 124/53 136/60 (!) 138/57 (!) 139/52  ?Pulse: 72 83 73 77  ?Resp: 18 20 13 16   ?Temp: 97.9 ?F (36.6 ?C) 98.2 ?F (36.8 ?C) (!) 97.5 ?F (36.4 ?C) 97.8 ?F (36.6 ?C)  ?TempSrc: Oral Oral Oral Oral  ?SpO2: 100% 98% 99% 97%  ?Weight:      ?Height:      ? ? ?Intake/Output Summary (Last 24 hours) at 12/17/2021 1115 ?Last data filed at 12/17/2021 6384 ?Gross per 24 hour  ?Intake --  ?Output 1900 ml  ?Net -1900 ml  ? ?Filed Weights  ? 12/15/21 0043  ?Weight: 98.6 kg  ? ? ?Examination: ? ?General: Frail and debilitated.  Flat affect.  Not in any distress.  On room air.  Looks comfortable.  Hard of hearing. ?Cardiovascular: S1-S2 normal.  Regular rate rhythm. ?Respiratory: Bilateral clear.  No added sounds. ?Gastrointestinal: Soft.  Nontender.  Obese and pendulous.  Bowel sound present. ?Ext: No swelling or edema.  No cyanosis. ?Neuro: Intact. ? ? ? ? ?Data Reviewed: I have personally reviewed following labs and imaging studies ? ?CBC: ?Recent Labs  ?Lab 12/15/21 ?0149 12/15/21 ?9371 12/15/21 ?1702 12/16/21 ?6967 12/17/21 ?0125  ?WBC 16.1*  --   --   --  12.4*  ?NEUTROABS  --   --   --   --  9.3*  ?HGB 8.5* 8.3* 8.3* 8.0* 7.9*  ?HCT 26.5*  25.6* 26.0* 24.7* 24.6*  ?MCV 91.7  --   --   --  92.1  ?PLT 158  --   --   --  143*  ? ?Basic Metabolic Panel: ?Recent Labs  ?Lab 12/15/21 ?0149 12/16/21 ?0333 12/17/21 ?0125  ?NA 149* 145 145  ?K 4.1 3.9 3.6  ?CL 127* 123* 125*  ?CO2 18* 19* 19*  ?GLUCOSE 150* 177* 139*  ?BUN 108* 93* 71*  ?CREATININE 2.91* 2.58* 2.16*  ?CALCIUM 8.3* 8.1* 8.0*  ? ?GFR: ?Estimated Creatinine Clearance: 28.8 mL/min (A) (by C-G formula based on SCr of 2.16 mg/dL (H)). ?Liver Function Tests: ?Recent Labs  ?Lab 12/15/21 ?0149  ?AST 17  ?ALT 17  ?ALKPHOS 26*  ?BILITOT 0.4  ?PROT 4.5*  ?ALBUMIN 2.4*  ? ?No results for input(s): LIPASE, AMYLASE in the last 168 hours. ?No results for input(s): AMMONIA in the last 168 hours. ?Coagulation Profile: ?Recent Labs  ?Lab 12/15/21 ?0149  ?INR 1.3*  ? ?Cardiac Enzymes: ?No results for input(s): CKTOTAL, CKMB, CKMBINDEX, TROPONINI in the last 168 hours. ?BNP (last 3 results) ?No results for input(s): PROBNP in the last 8760 hours. ?HbA1C: ?No results for input(s): HGBA1C in the last 72 hours. ?CBG: ?No results for input(s): GLUCAP in the last 168 hours. ?Lipid Profile: ?No results for input(s): CHOL, HDL, LDLCALC, TRIG, CHOLHDL, LDLDIRECT in the last 72 hours. ?Thyroid Function Tests: ?No results for input(s): TSH, T4TOTAL, FREET4, T3FREE, THYROIDAB in the last 72 hours. ?Anemia Panel: ?No results for input(s): VITAMINB12, FOLATE, FERRITIN, TIBC, IRON, RETICCTPCT in the last 72 hours. ?Sepsis Labs: ?No results for input(s): PROCALCITON, LATICACIDVEN in the last 168 hours. ? ?No results found for this or any previous visit (from the past 240 hour(s)).  ? ? ? ? ? ?Radiology Studies: ?IR Angiogram Visceral Selective ? ?Result Date: 12/15/2021 ?INDICATION: 88 year old with upper GI bleed. Patient had recent endoscopic treatments for a stomach lesion and duodenal bulb ulcer. There is concern for ongoing bleeding at the duodenal bulb and patient was transferred to [facility] for embolization.  Patient has acute kidney injury and history of contrast allergy. Patient was given 13 hour premedications for the contrast allergy. Plan for empiric embolization of the gastroduodenal artery based on the endoscopic findings and inability to perform significant diagnostic angiography due to patient's poor renal function. EXAM: 1. Mesenteric arteriogram 2. Coil embolization of gastroduodenal artery 3. Ultrasound guidance for vascular access MEDICATIONS: Moderate sedation ANESTHESIA/SEDATION: Moderate (conscious) sedation was employed during this procedure. A total of Versed 1.0 mg and Fentanyl 25 mcg was administered intravenously by the radiology nurse. Total intra-service moderate Sedation Time: 54 minutes. The patient's level of consciousness and vital signs were monitored continuously by radiology nursing throughout the procedure under my direct supervision. CONTRAST:  25 mL Omnipaque 300 FLUOROSCOPY: Radiation Exposure Index (as provided by the fluoroscopic device): 893 mGy Kerma COMPLICATIONS: None immediate. PROCEDURE: Informed consent was obtained from the patient following explanation of the procedure, risks, benefits and alternatives. The patient and family understands, agrees and consents for the procedure. All questions were addressed.  A time out was performed prior to the initiation of the procedure. Patient was placed supine on the interventional table. Ultrasound confirmed a patent right common femoral artery. Ultrasound image was saved for documentation. Right groin was prepped and draped in sterile fashion. Maximal barrier sterile technique was utilized including caps, mask, sterile gowns, sterile gloves, sterile drape, hand hygiene and skin antiseptic. Right groin was anesthetized using 1% lidocaine. Small incision was made. Using ultrasound guidance, 21 gauge needle was directed in the right common femoral artery and micropuncture dilator set was placed. Five French vascular sheath was placed. C2  catheter was used to cannulate the celiac artery. Contrast injection was performed in the celiac artery. STC Renegade microcatheter was advanced into the common hepatic artery and angiography was performed. The

## 2021-12-17 NOTE — Progress Notes (Addendum)
Granddaughter requested to speak privately w/ RN- she has several concerns about the safety of pt coming home with just home health PT. She requests to meet w/ PT, case management, social work, etc to discuss options to have more help at home and fill out a HCPOA form.  ?She states the pt's wife has been struggling w/ memory issues in addition to pt being profoundly weak and is concerned w/ her ability to take care of both of them. She is requesting the pt NOT be dc'd in the morning. ? ?I assured her we will follow up w/ day shift staff, and she is aware she may have to wait until Monday to speak w/ CSW/CM.  ? ?Granddaughter's info: [name]- [phone] ? ? ?

## 2021-12-17 NOTE — Progress Notes (Signed)
PT Cancellation Note ? ?Patient Details ?Name: ALONTAE CHALOUX ?MRN: [id number] ?DOB: [birth year] ? ? ?Cancelled Treatment:    Reason Eval/Treat Not Completed: Fatigue/lethargy limiting ability to participate.  Attempted again this pm, patient still too fatigued.  ? ? ?Shanna Cisco ?12/17/2021, 3:00 PM ?

## 2021-12-17 NOTE — Evaluation (Signed)
Occupational Therapy Evaluation ?Patient Details ?Name: [name] ?MRN: [id number] ?DOB: [birth year] ?Today's Date: 12/17/2021 ? ? ?History of Present Illness Pt is an 88 y.o. male who presented 12/15/21 for embolization of GI bleed. PMH significant for lung CA with lobectomy, HTN, OSA and CKD stage III  ? ?Clinical Impression ?  ?PT PTA: Pt living at home with spouse, mostly independent with ADL and mobility. Pt currently, performing set-upA to minA overall for functional mobility and ADL tasks. Pt minA for transfer to recliner with RW and fatigues very quickly. Pt's O2 >90% on RA; BP readings:  ?sitting: 147/57, 97% O2, 87 BPM;  ?standing: 151/46, 97% O2, 88 BPM;  ?standing at 3 mins: 136/48, 96% O2, 80 BPM; reporting mild light headedness. Pt would benefit from continued OT skilled servicse. ?   ? ?Recommendations for follow up therapy are one component of a multi-disciplinary discharge planning process, led by the attending physician.  Recommendations may be updated based on patient status, additional functional criteria and insurance authorization.  ? ?Follow Up Recommendations ? Home health OT  ?  ?Assistance Recommended at Discharge Intermittent Supervision/Assistance  ?Patient can return home with the following A little help with walking and/or transfers;A little help with bathing/dressing/bathroom;Assistance with cooking/housework;Assistance with feeding;Assist for transportation ? ?  ?Functional Status Assessment ? Patient has had a recent decline in their functional status and demonstrates the ability to make significant improvements in function in a reasonable and predictable amount of time.  ?Equipment Recommendations ? BSC/3in1  ?  ?Recommendations for Other Services   ? ? ?  ?Precautions / Restrictions Precautions ?Precautions: Fall;Other (comment) ?Precaution Comments: watch SpO2 and BP ?Restrictions ?Weight Bearing Restrictions: No  ? ?  ? ?Mobility Bed Mobility ?Overal bed mobility: Needs  Assistance ?Bed Mobility: Supine to Sit ?  ?  ?Supine to sit: Min guard, HOB elevated ?  ?  ?General bed mobility comments: assist with hand placement ?  ? ?Transfers ?Overall transfer level: Needs assistance ?Equipment used: Rolling walker (2 wheels) ?Transfers: Sit to/from Stand, Bed to chair/wheelchair/BSC ?Sit to Stand: Min assist ?  ?  ?Step pivot transfers: Min assist ?  ?  ?General transfer comment: MinA overall with RW; fatigues easily and requires assist to avoid plopping. ?  ? ?  ?Balance Overall balance assessment: Needs assistance ?Sitting-balance support: No upper extremity supported, Feet supported ?Sitting balance-Leahy Scale: Fair ?  ?  ?  ?Standing balance-Leahy Scale: Poor ?Standing balance comment: MinA for support with RW ?  ?  ?  ?  ?  ?  ?  ?  ?  ?  ?  ?   ? ?ADL either performed or assessed with clinical judgement  ? ?ADL Overall ADL's : Needs assistance/impaired ?Eating/Feeding: Set up;Sitting ?  ?Grooming: Set up;Sitting ?  ?Upper Body Bathing: Set up;Sitting ?  ?Lower Body Bathing: Min guard;Sitting/lateral leans;Sit to/from stand;Cueing for safety ?  ?Upper Body Dressing : Set up;Sitting ?  ?Lower Body Dressing: Min guard;Cueing for safety;Sitting/lateral leans;Sit to/from stand ?  ?Toilet Transfer: Minimal assistance;Cueing for safety;BSC/3in1;Rolling walker (2 wheels) ?  ?Toileting- Clothing Manipulation and Hygiene: Minimal assistance;Cueing for safety;Cueing for sequencing;Sitting/lateral lean;Sit to/from stand ?  ?  ?  ?Functional mobility during ADLs: Minimal assistance;Cueing for safety;Rollator (4 wheels);Standard walker ?General ADL Comments: Pt performing set-upA to minA overall for functional mobility and ADL tasks. Pt usign male pure wick at this time.  ? ? ? ?Vision Baseline Vision/History: 0 No visual deficits ?Ability to See in Adequate Light:  0 Adequate ?Patient Visual Report: No change from baseline ?Vision Assessment?: No apparent visual deficits  ?   ?Perception   ?   ?Praxis   ?  ? ?Pertinent Vitals/Pain Pain Assessment ?Pain Assessment: 0-10 ?Pain Score: 3  ?Pain Location: L upper abdomen ?Pain Descriptors / Indicators: Discomfort, Grimacing, Guarding ?Pain Intervention(s): Monitored during session, Repositioned  ? ? ? ?Hand Dominance Right ?  ?Extremity/Trunk Assessment Upper Extremity Assessment ?Upper Extremity Assessment: Generalized weakness;RUE deficits/detail;LUE deficits/detail ?RUE Deficits / Details: strength 3+/5 ?LUE Deficits / Details: strength 3+/5 ?  ?Lower Extremity Assessment ?Lower Extremity Assessment: Generalized weakness ?  ?Cervical / Trunk Assessment ?Cervical / Trunk Assessment: Normal ?  ?Communication Communication ?Communication: HOH ?  ?Cognition Arousal/Alertness: Awake/alert ?Behavior During Therapy: [facility] for tasks assessed/performed ?Overall Cognitive Status: Within Functional Limits for tasks assessed ?  ?  ?  ?  ?  ?  ?  ?  ?  ?  ?  ?  ?  ?  ?  ?  ?General Comments: A/O x4; able to explain situation ?  ?  ?General Comments  O2 >90% on RA; BP readings: sitting: 147/57, 97% O2, 87 BPM; standing: 151/46, 97% O2, 88 BPM; standing at 3 mins: 136/48, 96% O2, 80 BPM; reporting mild light headedness. ? ?  ?Exercises   ?  ?Shoulder Instructions    ? ? ?Home Living Family/patient expects to be discharged to:: Private residence ?Living Arrangements: Spouse/significant other ?Available Help at Discharge: Family;Available 24 hours/day ?Type of Home: House ?Home Access: Stairs to enter ?Entrance Stairs-Number of Steps: 2-3 ?Entrance Stairs-Rails: Right ?Home Layout: One level ?  ?  ?Bathroom Shower/Tub: Walk-in shower ?  ?Bathroom Toilet: Standard ?  ?  ?Home Equipment: Cane - quad ?  ?  ?  ? ?  ?Prior Functioning/Environment Prior Level of Function : Independent/Modified Independent;Driving ?  ?  ?  ?  ?  ?  ?Mobility Comments: Does not use AD. No recent falls. Does not walk long distances due to endurance deficits. ?ADLs Comments: No cognitive deficits,  just HOH.Assist with iADL grocery shopping due to fatigue ?  ? ?  ?  ?OT Problem List: Decreased strength;Decreased activity tolerance;Impaired balance (sitting and/or standing);Decreased safety awareness;Pain;Decreased knowledge of use of DME or AE;Increased edema ?  ?   ?OT Treatment/Interventions: Self-care/ADL training;Therapeutic exercise;Energy conservation;DME and/or AE instruction;Therapeutic activities;Patient/family education;Balance training  ?  ?OT Goals(Current goals can be found in the care plan section) Acute Rehab OT Goals ?Patient Stated Goal: to go home ?OT Goal Formulation: With patient ?Time For Goal Achievement: 12/31/21 ?Potential to Achieve Goals: Good ?ADL Goals ?Pt Will Perform Grooming: with set-up;standing ?Pt Will Perform Lower Body Dressing: with min guard assist;sit to/from stand ?Pt Will Transfer to Toilet: with supervision;ambulating;bedside commode ?Additional ADL Goal #1: Pt will state 3 energy conservation strategies to improve ADL and mobility at home.  ?OT Frequency: Min 2X/week ?  ? ?Co-evaluation   ?  ?  ?  ?  ? ?  ?AM-PAC OT "6 Clicks" Daily Activity     ?Outcome Measure Help from another person eating meals?: None ?Help from another person taking care of personal grooming?: A Little ?Help from another person toileting, which includes using toliet, bedpan, or urinal?: A Lot ?Help from another person bathing (including washing, rinsing, drying)?: A Lot ?Help from another person to put on and taking off regular upper body clothing?: A Little ?Help from another person to put on and taking off regular lower body clothing?: A Lot ?  6 Click Score: 16 ?  ?End of Session Equipment Utilized During Treatment: Gait belt;Rolling walker (2 wheels) ?Nurse Communication: Mobility status ? ?Activity Tolerance: Patient tolerated treatment well ?Patient left: in chair;with call bell/phone within reach;with family/visitor present ? ?OT Visit Diagnosis: Unsteadiness on feet (R26.81);Muscle  weakness (generalized) (M62.81)  ?              ?Time: 0721-8288 ?OT Time Calculation (min): 26 min ?Charges:  OT General Charges ?$OT Visit: 1 Visit ?OT Evaluation ?$OT Eval Moderate Complexity: 1 Mod ?OT Treatments ?$Sel

## 2021-12-17 NOTE — Progress Notes (Signed)
PT Cancellation Note ? ?Patient Details ?Name: [name] ?MRN: [id number] ?DOB: Jan 18, 1931 ? ? ?Cancelled Treatment:    Reason Eval/Treat Not Completed: Fatigue/lethargy limiting ability to participate. Pt just got back to bed from sitting up in the chair, requesting PT return later in day as time permits. ? ? ?Moishe Spice, PT, DPT ?Acute Rehabilitation Services  ?Pager: [phone] ?Office: [phone] ? ? ? ?Maretta Bees Pettis ?12/17/2021, 10:30 AM ? ? ?

## 2021-12-18 DIAGNOSIS — K922 Gastrointestinal hemorrhage, unspecified: Secondary | ICD-10-CM | POA: Diagnosis not present

## 2021-12-18 LAB — BASIC METABOLIC PANEL
Anion gap: 3 — ABNORMAL LOW (ref 5–15)
BUN: 51 mg/dL — ABNORMAL HIGH (ref 8–23)
CO2: 20 mmol/L — ABNORMAL LOW (ref 22–32)
Calcium: 7.9 mg/dL — ABNORMAL LOW (ref 8.9–10.3)
Chloride: 120 mmol/L — ABNORMAL HIGH (ref 98–111)
Creatinine, Ser: 1.77 mg/dL — ABNORMAL HIGH (ref 0.61–1.24)
GFR, Estimated: 36 mL/min — ABNORMAL LOW (ref >60)
Glucose, Bld: 117 mg/dL — ABNORMAL HIGH (ref 70–99)
Potassium: 3.7 mmol/L (ref 3.5–5.1)
Sodium: 143 mmol/L (ref 135–145)

## 2021-12-18 MED ORDER — NITROGLYCERIN 0.4 MG SL SUBL: 0.4000 mg | SUBLINGUAL_TABLET | SUBLINGUAL | Status: DC | PRN

## 2021-12-18 MED ORDER — LEVOTHYROXINE SODIUM 75 MCG PO TABS
75.0000 ug | ORAL_TABLET | Freq: Every day | ORAL | Status: DC
  Administered 2021-12-19 – 2021-12-20 (×2): 75 ug via ORAL
  Filled 2021-12-18 (×3): qty 1

## 2021-12-18 MED ORDER — LACTULOSE 10 GM/15ML PO SOLN
30.0000 g | Freq: Once | ORAL | Status: AC
  Administered 2021-12-18: 30 g via ORAL
  Filled 2021-12-18: qty 45

## 2021-12-18 MED ORDER — BISACODYL 10 MG RE SUPP
10.0000 mg | Freq: Once | RECTAL | Status: DC
  Filled 2021-12-18: qty 1

## 2021-12-18 NOTE — Progress Notes (Signed)
Physical Therapy Treatment ?Patient Details ?Name: [name] ?MRN: [id number] ?DOB: Dec 19, 1930 ?Today's Date: 12/18/2021 ? ? ?History of Present Illness Pt is an 88 y.o. male who presented 12/15/21 for embolization of GI bleed. PMH significant for lung CA with lobectomy, HTN, OSA and CKD stage III ? ?  ?PT Comments  ? ? Upon arrival, pt reported continuing to feel very fatigued but was not lightheaded while supine. However, once sitting EOB he became lightheaded and began to sweat. His BP decreased from 150/54 supine to a SBP in the low 100s. It did improve to 130s with prolonged sitting and seated exercises, but he remained symptomatic (but stable with symptoms). Plan was to stand and progress gait, but deferred to just performing lateral scoot transfer bed > recliner for pt safety and due to pt fatigue. Completed session with lower extremity exercises in sitting. Educated pt and wife to recline pt if his symptoms worsen but to alternate reclining and sitting upright to improve pt's upright tolerance. They verbalized understanding. RN made aware. Due to pt's continued worsening lethargy and lightheadedness limiting his mobility, updated d/c recs to SNF for short-term rehab to improve his independence and safety with all functional mobility prior to return home. Will continue to follow acutely. ? ?  ?Recommendations for follow up therapy are one component of a multi-disciplinary discharge planning process, led by the attending physician.  Recommendations may be updated based on patient status, additional functional criteria and insurance authorization. ? ?Follow Up Recommendations ? Skilled nursing-short term rehab (<3 hours/day) ?  ?  ?Assistance Recommended at Discharge Intermittent Supervision/Assistance  ?Patient can return home with the following A little help with bathing/dressing/bathroom;Assistance with cooking/housework;Assist for transportation;Help with stairs or ramp for entrance;A lot of help with  walking and/or transfers ?  ?Equipment Recommendations ? Rolling walker (2 wheels);BSC/3in1  ?  ?Recommendations for Other Services   ? ? ?  ?Precautions / Restrictions Precautions ?Precautions: Fall;Other (comment) ?Precaution Comments: watch BP ?Restrictions ?Weight Bearing Restrictions: No  ?  ? ?Mobility ? Bed Mobility ?Overal bed mobility: Needs Assistance ?Bed Mobility: Supine to Sit ?  ?  ?Supine to sit: Min guard, HOB elevated ?  ?  ?General bed mobility comments: Extra time and use of bed rail to ascend trunk, HOB elevated, min guard for safety. ?  ? ?Transfers ?Overall transfer level: Needs assistance ?Equipment used: None ?Transfers: Bed to chair/wheelchair/BSC ?  ?  ?  ?  ?  ? Lateral/Scoot Transfers: Min assist ?General transfer comment: Initially was going to attempt transfer to stand but pt became sweaty and very lightheaded thus pt deferred to lateral scoot transfer bed > recliner instead for safety, minA to control final scoot to chair. ?  ? ?Ambulation/Gait ?  ?  ?  ?  ?  ?  ?  ?General Gait Details: deferred for safety and due to pt fatigue ? ? ?Stairs ?  ?  ?  ?  ?  ? ? ?Wheelchair Mobility ?  ? ?Modified Rankin (Stroke Patients Only) ?  ? ? ?  ?Balance Overall balance assessment: Needs assistance ?Sitting-balance support: No upper extremity supported, Feet supported, Bilateral upper extremity supported ?Sitting balance-Leahy Scale: Fair ?Sitting balance - Comments: Static sitting EOB with supervision for safety, prefers UE support due to not feeling well and being lightheaded ?  ?  ?  ?Standing balance comment: deferred ?  ?  ?  ?  ?  ?  ?  ?  ?  ?  ?  ?  ? ?  ?  Cognition Arousal/Alertness: Awake/alert ?Behavior During Therapy: [facility] for tasks assessed/performed ?Overall Cognitive Status: Within Functional Limits for tasks assessed ?  ?  ?  ?  ?  ?  ?  ?  ?  ?  ?  ?  ?  ?  ?  ?  ?  ?  ?  ? ?  ?Exercises General Exercises - Lower Extremity ?Ankle Circles/Pumps: AROM, Both, 10 reps, Seated ?Long Arc  Quad: AROM, Strengthening, Both, 20 reps, Seated ?Hip ABduction/ADduction: AROM, Strengthening, Both, 10 reps, Seated ?Hip Flexion/Marching: AROM, Strengthening, Both, 10 reps, Seated ? ?  ?General Comments General comments (skin integrity, edema, etc.): SpO2 stable on RA but pt SOB with exertion; BP 150/54 supine (no symptoms), SBP decreased to low 100s sitting EOB, SBP increased to 130s with seated exercises, 161/56 after transfer, SBP in 130s with 2 additional BP measurements once in recliner with symptoms reportedly stable but present; lightheaded throughout remainder of session with sweating once transitioned to sit EOB ?  ?  ? ?Pertinent Vitals/Pain Pain Assessment ?Pain Assessment: Faces ?Faces Pain Scale: Hurts a little bit ?Pain Location: generalized discomfort/lightheadedness with mobility ?Pain Descriptors / Indicators: Discomfort ?Pain Intervention(s): Limited activity within patient's tolerance, Monitored during session, Repositioned  ? ? ?Home Living   ?  ?  ?  ?  ?  ?  ?  ?  ?  ?   ?  ?Prior Function    ?  ?  ?   ? ?PT Goals (current goals can now be found in the care plan section) Acute Rehab PT Goals ?Patient Stated Goal: to get better ?PT Goal Formulation: With patient/family ?Time For Goal Achievement: 12/30/21 ?Potential to Achieve Goals: Good ?Progress towards PT goals: Not progressing toward goals - comment (limited by low BP) ? ?  ?Frequency ? ? ? Min 3X/week ? ? ? ?  ?PT Plan Discharge plan needs to be updated;Equipment recommendations need to be updated  ? ? ?Co-evaluation   ?  ?  ?  ?  ? ?  ?AM-PAC PT "6 Clicks" Mobility   ?Outcome Measure ? Help needed turning from your back to your side while in a flat bed without using bedrails?: A Little ?Help needed moving from lying on your back to sitting on the side of a flat bed without using bedrails?: A Little ?Help needed moving to and from a bed to a chair (including a wheelchair)?: Total ?Help needed standing up from a chair using your arms  (e.g., wheelchair or bedside chair)?: Total ?Help needed to walk in hospital room?: Total ?Help needed climbing 3-5 steps with a railing? : Total ?6 Click Score: 10 ? ?  ?End of Session   ?Activity Tolerance: Other (comment);Patient limited by fatigue (limited by lightheadedness) ?Patient left: in chair;with call bell/phone within reach;with family/visitor present ?Nurse Communication: Mobility status;Other (comment) (vitals) ?PT Visit Diagnosis: Unsteadiness on feet (R26.81);Other abnormalities of gait and mobility (R26.89);Muscle weakness (generalized) (M62.81);Difficulty in walking, not elsewhere classified (R26.2);Pain ?  ? ? ?Time: 2633-3545 ?PT Time Calculation (min) (ACUTE ONLY): 30 min ? ?Charges:  $Therapeutic Exercise: 8-22 mins ?$Therapeutic Activity: 8-22 mins          ?          ? ?Moishe Spice, PT, DPT ?Acute Rehabilitation Services  ?Pager: [phone] ?Office: [phone] ? ? ? ?Maretta Bees Pettis ?12/18/2021, 2:05 PM ?  ?

## 2021-12-18 NOTE — Progress Notes (Signed)
?PROGRESS NOTE ? ? ? ?[name]  NGE:[id number] DOB: 05-20-1931 DOA: 12/15/2021 ?PCP: Bonnita Nasuti, MD  ? ? ?Brief Narrative:  ?88 year old gentleman with history of recurrent lung cancer treated with lobectomy and subsequent radiation, hypertension, stage IIIb CKD with baseline creatinine about 1.8 admitted with GI bleeding. ?Admitted to [facility] with GI bleeding and acute kidney injury.  Transferred to [facility] with evidence of ongoing GI bleeding. ? ? ?Assessment & Plan: ?  ?Acute upper GI bleeding, acute blood loss anemia: ?EGD 4/23, active bleeding from the stomach and questionable Dieulafoy lesion, clipped and cauterized ?EGD 4/25, second site of bleeding from a small duodenal ulcer in the distal duodenal bulb treated with sclerotherapy ?Colonoscopy 4/25, full of blood. No bleeding points. ?Total blood transfusions-7 units PRBC and 1 unit FFP at [facility]. ?Hemoglobin 8.5-8.3-8-7.9-8 now.  No clinical evidence of ongoing bleeding.   ?Angiogram and gastroduodenal artery embolization by IR 4/27. ?Oral Protonix now and on discharge. ?Allow regular diet and mobilize.  Laxatives to help with bowel movement today. ?Additional dose of lactulose today. ? ?Acute kidney injury superimposed on chronic kidney disease stage IIIb: Hypernatremia. ?Recent baseline creatinine of 1.8 in 2/23 ?Multifactorial acute kidney injury.  No evidence of uremia. ?Creatinine 2.91-2.58-2.1-1.77.  Able to maintain without additional IV fluids. ? ?Essential hypertension: Antihypertensives on hold with risk of hypotension.  He may not need further hypertensives. ? ?Hyperlipidemia: Statins on hold.  We will discontinue his statin, limited life expectancy. ? ?BPH: Resume Flomax.  He will tolerate. ? ?Right arm IV infiltration: Uncomplicated.  Ice pack.  Improving. ? ?Deconditioning/debility: Work with PT OT.  Very debilitated.  He will need short-term rehab at a skilled nursing facility before going home. ?PT and OT  should reevaluate today. ? ? ?DVT prophylaxis: SCDs Start: 12/15/21 0201 ? ? ?Code Status: Full code ?Family Communication: Wife at the bedside.  Granddaughter on the phone. ?Disposition Plan: Status is: Inpatient ?Remains inpatient appropriate because: Unsafe discharge home. ?  ?Mobilize.  Laxative.  Will need skilled nursing rehab. ? ? ?Consultants:  ?Interventional radiology ? ?Procedures:  ?Multiple procedures at [facility] ?Angiography with gastroduodenal artery embolization. ? ?Antimicrobials:  ?None ? ? ?Subjective: ? ?Seen and examined.  No overnight events.  He did eat half of the dinner last night.  Denies any nausea vomiting or abdominal pain.  Wife at the bedside. ? ?Objective: ?Vitals:  ? 12/17/21 2020 12/17/21 2300 12/18/21 0327 12/18/21 0737  ?BP: (!) 138/46 (!) 125/58 (!) 135/55 (!) 132/57  ?Pulse: 93 89 76 70  ?Resp: 16 16 16 16   ?Temp: 97.8 ?F (36.6 ?C) 98.1 ?F (36.7 ?C) 98.2 ?F (36.8 ?C) 98 ?F (36.7 ?C)  ?TempSrc: Oral Oral Oral Oral  ?SpO2: 98% 97% 98% 97%  ?Weight:      ?Height:      ? ? ?Intake/Output Summary (Last 24 hours) at 12/18/2021 1232 ?Last data filed at 12/18/2021 0327 ?Gross per 24 hour  ?Intake --  ?Output 1950 ml  ?Net -1950 ml  ? ?Filed Weights  ? 12/15/21 0043  ?Weight: 98.6 kg  ? ? ?Examination: ? ?General: Frail and debilitated.  Not in any distress.  On room air.  Looks comfortable.   ?Cardiovascular: S1-S2 normal.  Regular rate rhythm. ?Respiratory: Bilateral clear.  No added sounds. ?Gastrointestinal: Soft.  Nontender.  Obese and pendulous.  Bowel sound present. ?Ext: No swelling or edema.  No cyanosis. ?Neuro: Intact.  Flat affect.  Generalized weakness.  No focal deficits. ? ? ? ? ?  Data Reviewed: I have personally reviewed following labs and imaging studies ? ?CBC: ?Recent Labs  ?Lab 12/15/21 ?0149 12/15/21 ?3646 12/15/21 ?1702 12/16/21 ?8032 12/17/21 ?0125 12/17/21 ?1117  ?WBC 16.1*  --   --   --  12.4* 13.6*  ?NEUTROABS  --   --   --   --  9.3* 10.7*  ?HGB 8.5* 8.3*  8.3* 8.0* 7.9* 8.7*  ?HCT 26.5* 25.6* 26.0* 24.7* 24.6* 26.5*  ?MCV 91.7  --   --   --  92.1 94.0  ?PLT 158  --   --   --  143* 150  ? ?Basic Metabolic Panel: ?Recent Labs  ?Lab 12/15/21 ?0149 12/16/21 ?1224 12/17/21 ?0125 12/18/21 ?0326  ?NA 149* 145 145 143  ?K 4.1 3.9 3.6 3.7  ?CL 127* 123* 125* 120*  ?CO2 18* 19* 19* 20*  ?GLUCOSE 150* 177* 139* 117*  ?BUN 108* 93* 71* 51*  ?CREATININE 2.91* 2.58* 2.16* 1.77*  ?CALCIUM 8.3* 8.1* 8.0* 7.9*  ? ?GFR: ?Estimated Creatinine Clearance: 35.1 mL/min (A) (by C-G formula based on SCr of 1.77 mg/dL (H)). ?Liver Function Tests: ?Recent Labs  ?Lab 12/15/21 ?0149  ?AST 17  ?ALT 17  ?ALKPHOS 26*  ?BILITOT 0.4  ?PROT 4.5*  ?ALBUMIN 2.4*  ? ?No results for input(s): LIPASE, AMYLASE in the last 168 hours. ?No results for input(s): AMMONIA in the last 168 hours. ?Coagulation Profile: ?Recent Labs  ?Lab 12/15/21 ?0149  ?INR 1.3*  ? ?Cardiac Enzymes: ?No results for input(s): CKTOTAL, CKMB, CKMBINDEX, TROPONINI in the last 168 hours. ?BNP (last 3 results) ?No results for input(s): PROBNP in the last 8760 hours. ?HbA1C: ?No results for input(s): HGBA1C in the last 72 hours. ?CBG: ?No results for input(s): GLUCAP in the last 168 hours. ?Lipid Profile: ?No results for input(s): CHOL, HDL, LDLCALC, TRIG, CHOLHDL, LDLDIRECT in the last 72 hours. ?Thyroid Function Tests: ?No results for input(s): TSH, T4TOTAL, FREET4, T3FREE, THYROIDAB in the last 72 hours. ?Anemia Panel: ?No results for input(s): VITAMINB12, FOLATE, FERRITIN, TIBC, IRON, RETICCTPCT in the last 72 hours. ?Sepsis Labs: ?No results for input(s): PROCALCITON, LATICACIDVEN in the last 168 hours. ? ?No results found for this or any previous visit (from the past 240 hour(s)).  ? ? ? ? ? ?Radiology Studies: ?No results found. ? ? ? ? ? ?Scheduled Meds: ? [START ON 12/19/2021] levothyroxine  75 mcg Oral QAC breakfast  ? mouth rinse  15 mL Mouth Rinse BID  ? pantoprazole  40 mg Oral BID  ? polyethylene glycol  17 g Oral Daily  ?  tamsulosin  0.8 mg Oral Daily  ? ?Continuous Infusions: ? ? ? ? LOS: 3 days  ? ? ?Time spent: 35 minutes ? ? ? ?Barb Merino, MD ?[facility] ?Pager [phone] ? ?

## 2021-12-19 ENCOUNTER — Encounter (HOSPITAL_COMMUNITY): Payer: Self-pay

## 2021-12-19 DIAGNOSIS — K922 Gastrointestinal hemorrhage, unspecified: Secondary | ICD-10-CM | POA: Diagnosis not present

## 2021-12-19 NOTE — TOC Progression Note (Signed)
Transition of Care (TOC) - Progression Note  ? ? ?Patient Details  ?Name: [name] ?MRN: [id number] ?Date of Birth: 01/20/1931 ? ?Transition of Care (TOC) CM/SW Contact  ?Vinie Sill, LCSW ?Phone Number: ?12/19/2021, 1:52 PM ? ?Clinical Narrative:    ? ?CSW sent message [facility]- waiting on response ? ?CSW started insurance authorization # [id number] ? ? ?Expected Discharge Plan: [place] ?Barriers to Discharge: Ship broker, SNF Pending bed offer ? ?Expected Discharge Plan and Services ?Expected Discharge Plan: [place] ?In-house Referral: Clinical Social Work ?  ?  ?  ?                ?  ?  ?  ?  ?  ?  ?  ?  ?  ?  ? ? ?Social Determinants of Health (SDOH) Interventions ?  ? ?Readmission Risk Interventions ?   ? View : No data to display.  ?  ?  ?  ? ? ?

## 2021-12-19 NOTE — Care Management Important Message (Signed)
Important Message ? ?Patient Details  ?Name: [name] ?MRN: [id number] ?Date of Birth: 30-Jun-1931 ? ? ?Medicare Important Message Given:  Yes ? ? ? ? ?Shelda Altes ?12/19/2021, 9:22 AM ?

## 2021-12-19 NOTE — TOC Progression Note (Signed)
Transition of Care (TOC) - Progression Note  ? ? ?Patient Details  ?Name: [name] ?MRN: [id number] ?Date of Birth: 05/20/1931 ? ?Transition of Care (TOC) CM/SW Contact  ?Vinie Sill, LCSW ?Phone Number: ?12/19/2021, 3:27 PM ? ?Clinical Narrative:    ? ?[facility] confirmed availability- insurance auth remains pending. ? ?Expected Discharge Plan: [place] ?Barriers to Discharge: Ship broker, SNF Pending bed offer ? ?Expected Discharge Plan and Services ?Expected Discharge Plan: [place] ?In-house Referral: Clinical Social Work ?  ?  ?  ?                ?  ?  ?  ?  ?  ?  ?  ?  ?  ?  ? ? ?Social Determinants of Health (SDOH) Interventions ?  ? ?Readmission Risk Interventions ?   ? View : No data to display.  ?  ?  ?  ? ? ?

## 2021-12-19 NOTE — Progress Notes (Signed)
Occupational Therapy Treatment ?Patient Details ?Name: [name] ?MRN: [id number] ?DOB: [birth year] ?Today's Date: 12/19/2021 ? ? ?History of present illness Pt is an 88 y.o. male who presented 12/15/21 for embolization of GI bleed. PMH significant for lung CA with lobectomy, HTN, OSA and CKD stage III ?  ?OT comments ? This 88 yo male doing better today with overall mobility despite his c/o of dizziness and light headness with BP stable. He is greatly deconditioned from his PLOF and will continue to benefit from acute OT with follow up at SNF.  ? ?Recommendations for follow up therapy are one component of a multi-disciplinary discharge planning process, led by the attending physician.  Recommendations may be updated based on patient status, additional functional criteria and insurance authorization. ?   ?Follow Up Recommendations ? Skilled nursing-short term rehab (<3 hours/day)  ?  ?Assistance Recommended at Discharge Frequent or constant Supervision/Assistance  ?Patient can return home with the following ? A lot of help with walking and/or transfers;A lot of help with bathing/dressing/bathroom;Assistance with cooking/housework;Help with stairs or ramp for entrance;Assist for transportation ?  ?Equipment Recommendations ? BSC/3in1  ?  ?   ?Precautions / Restrictions Precautions ?Precautions: Fall ?Precaution Comments: watch BP (stable OT session 5/1) ?Restrictions ?Weight Bearing Restrictions: No  ? ? ?  ? ?Mobility Bed Mobility ?Overal bed mobility: Needs Assistance ?Bed Mobility: Supine to Sit ?  ?  ?Supine to sit: Min guard, HOB elevated ?  ?  ?General bed mobility comments: used rail and increased time ?  ? ?Transfers ?Overall transfer level: Needs assistance ?Equipment used: Rolling walker (2 wheels) ?Transfers: Sit to/from Stand, Bed to chair/wheelchair/BSC ?Sit to Stand: Min assist, From elevated surface ?Stand pivot transfers: Min assist, From elevated surface ?  ?  ?  ?  ?  ?  ?  ?Balance Overall balance  assessment: Needs assistance ?Sitting-balance support: No upper extremity supported, Feet supported ?Sitting balance-Leahy Scale: Good ?  ?  ?Standing balance support: Bilateral upper extremity supported ?Standing balance-Leahy Scale: Poor ?  ?  ?  ?  ?  ?  ?  ?  ?  ?  ?  ?  ?   ? ?ADL either performed or assessed with clinical judgement  ? ?ADL Overall ADL's : Needs assistance/impaired ?  ?  ?  ?  ?  ?  ?  ?  ?  ?  ?  ?  ?Toilet Transfer: Minimal assistance;Stand-pivot;Rolling walker (2 wheels) ?Toilet Transfer Details (indicate cue type and reason): bed (raised)>recliner ?Toileting- Clothing Manipulation and Hygiene: Total assistance ?Toileting - Clothing Manipulation Details (indicate cue type and reason): min A sit<>stand ?  ?  ?  ?  ?  ? ?Extremity/Trunk Assessment Upper Extremity Assessment ?Upper Extremity Assessment: Generalized weakness ?LUE Deficits / Details: edematous--MD/RN aware (elevation for now) ?  ?  ?  ?  ?  ? ?Vision Patient Visual Report: No change from baseline ?  ?  ?   ?   ? ?Cognition Arousal/Alertness: Awake/alert ?Behavior During Therapy: [facility] for tasks assessed/performed ?Overall Cognitive Status: Within Functional Limits for tasks assessed ?  ?  ?  ?  ?  ?  ?  ?  ?  ?  ?  ?  ?  ?  ?  ?  ?  ?  ?  ?   ?   ?   ?General Comments Pt reported dizziness upon sitting up from supine (BP 130/72), also reported this with tranfer to recliner (BP140/65)  ? ? ?  Pertinent Vitals/ Pain       Pain Assessment ?Pain Assessment: No/denies pain ? ?   ?   ? ?Frequency ? Min 2X/week  ? ? ? ? ?  ?Progress Toward Goals ? ?OT Goals(current goals can now be found in the care plan section) ? Progress towards OT goals: Progressing toward goals ? ?Acute Rehab OT Goals ?Patient Stated Goal: to go to rehab then home ?OT Goal Formulation: With patient ?Time For Goal Achievement: 12/31/21 ?Potential to Achieve Goals: Good  ?Plan Discharge plan needs to be updated   ? ?   ?AM-PAC OT "6 Clicks" Daily Activity     ?Outcome  Measure ? ? Help from another person eating meals?: None ?Help from another person taking care of personal grooming?: A Little ?Help from another person toileting, which includes using toliet, bedpan, or urinal?: A Lot ?Help from another person bathing (including washing, rinsing, drying)?: A Lot ?Help from another person to put on and taking off regular upper body clothing?: A Lot ?Help from another person to put on and taking off regular lower body clothing?: Total ?6 Click Score: 14 ? ?  ?End of Session Equipment Utilized During Treatment: Gait belt;Rolling walker (2 wheels) ? ?OT Visit Diagnosis: Unsteadiness on feet (R26.81);Muscle weakness (generalized) (M62.81) ?  ?Activity Tolerance Patient tolerated treatment well ?  ?Patient Left in chair;with call bell/phone within reach;with family/visitor present ?  ?Nurse Communication Mobility status (chat text with RN) ?  ? ?   ? ?Time: 1350-1426 ?OT Time Calculation (min): 36 min ? ?Charges: OT General Charges ?$OT Visit: 1 Visit ?OT Treatments ?$Self Care/Home Management : 23-37 mins ? ?[place], OTR/L ?Acute Rehab Services ?Pager [phone] ?Office [phone] ? ? ? ?Almon Register ?12/19/2021, 2:43 PM ?

## 2021-12-19 NOTE — Progress Notes (Signed)
?PROGRESS NOTE ? ? ? ?[name]  CLE:[id number] DOB: 1931/04/04 DOA: 12/15/2021 ?PCP: Bonnita Nasuti, MD  ? ? ?Brief Narrative:  ?88 year old gentleman with history of recurrent lung cancer treated with lobectomy and subsequent radiation, hypertension, stage IIIb CKD with baseline creatinine about 1.8 admitted with GI bleeding. ?Admitted to [facility] with GI bleeding and acute kidney injury.  Transferred to [facility] with evidence of ongoing GI bleeding. ? ? ?Assessment & Plan: ?  ?Acute upper GI bleeding, acute blood loss anemia: ?EGD 4/23, active bleeding from the stomach and questionable Dieulafoy lesion, clipped and cauterized ?EGD 4/25, second site of bleeding from a small duodenal ulcer in the distal duodenal bulb treated with sclerotherapy ?Colonoscopy 4/25, full of blood. No bleeding points. ?Total blood transfusions-7 units PRBC and 1 unit FFP at [facility]. ?Hemoglobin 8.5-8.3-8-7.9-8 now.  No clinical evidence of ongoing bleeding.   ?Angiogram and gastroduodenal artery embolization by IR 4/27. ?Oral Protonix now and on discharge. ?Allow regular diet and mobilize.  MiraLAX daily. ? ?Acute kidney injury superimposed on chronic kidney disease stage IIIb: Hypernatremia. ?Recent baseline creatinine of 1.8 in 2/23 ?Multifactorial acute kidney injury.  No evidence of uremia. ?Creatinine 2.91-2.58-2.1-1.77.  Able to maintain without additional IV fluids. ? ?Essential hypertension: Antihypertensives on hold with risk of hypotension.  He may not need further hypertensives. ? ?Hyperlipidemia: Statins on hold.  We will discontinue his statin, limited life expectancy. ? ?BPH: Flomax. ? ?Deconditioning/debility: Work with PT OT.  Very debilitated.  He will need short-term rehab at a skilled nursing facility before going home. ?PT and OT should reevaluate today. ? ? ?DVT prophylaxis: SCDs Start: 12/15/21 0201 ? ? ?Code Status: Full code ?Family Communication: Wife and granddaughter at  bedside. ?Disposition Plan: Status is: Inpatient ?Remains inpatient appropriate because: Unsafe discharge home.  Waiting for SNF. ?  ? ? ? ?Consultants:  ?Interventional radiology ? ?Procedures:  ?Multiple procedures at [facility] ?Angiography with gastroduodenal artery embolization. ? ?Antimicrobials:  ?None ? ? ?Subjective: ? ?Seen and examined.  He had 2 large bowel movements yesterday and felt relieved. ?He was able to eat all his breakfast today, was happy but had some nausea at the end.  Denies any other complaints.  Agreeable to go to short-term skilled nursing facility. ? ?Objective: ?Vitals:  ? 12/18/21 1555 12/18/21 2037 12/18/21 2311 12/19/21 0429  ?BP: (!) 157/61 (!) 165/66 (!) 139/57 134/60  ?Pulse: 93 100 81 70  ?Resp: 16 18 16 18   ?Temp: 98.1 ?F (36.7 ?C) 98.3 ?F (36.8 ?C) 98.2 ?F (36.8 ?C) 97.9 ?F (36.6 ?C)  ?TempSrc: Oral Oral Oral Oral  ?SpO2: 96% 97% 98% 94%  ?Weight:      ?Height:      ? ? ?Intake/Output Summary (Last 24 hours) at 12/19/2021 1028 ?Last data filed at 12/19/2021 0434 ?Gross per 24 hour  ?Intake --  ?Output 1800 ml  ?Net -1800 ml  ? ? ?Filed Weights  ? 12/15/21 0043  ?Weight: 98.6 kg  ? ? ?Examination: ? ?General: Looks comfortable.  On room air.  Hard of hearing. ?Cardiovascular: S1-S2 normal.  Regular rate rhythm. ?Respiratory: Bilateral clear.  No added sounds. ?Gastrointestinal: Soft.  Nontender.  Bowel sound present. ?Ext: No edema or swelling.  No cyanosis. ?Neuro: Intact.  Flat affect.  Hard of hearing.  No focal deficits. ?Musculoskeletal: No deformities. ? ? ? ? ? ?Data Reviewed: I have personally reviewed following labs and imaging studies ? ?CBC: ?Recent Labs  ?Lab 12/15/21 ?0149 12/15/21 ?9449  12/15/21 ?1702 12/16/21 ?4801 12/17/21 ?0125 12/17/21 ?1117  ?WBC 16.1*  --   --   --  12.4* 13.6*  ?NEUTROABS  --   --   --   --  9.3* 10.7*  ?HGB 8.5* 8.3* 8.3* 8.0* 7.9* 8.7*  ?HCT 26.5* 25.6* 26.0* 24.7* 24.6* 26.5*  ?MCV 91.7  --   --   --  92.1 94.0  ?PLT 158  --   --   --   143* 150  ? ? ?Basic Metabolic Panel: ?Recent Labs  ?Lab 12/15/21 ?0149 12/16/21 ?6553 12/17/21 ?0125 12/18/21 ?0326  ?NA 149* 145 145 143  ?K 4.1 3.9 3.6 3.7  ?CL 127* 123* 125* 120*  ?CO2 18* 19* 19* 20*  ?GLUCOSE 150* 177* 139* 117*  ?BUN 108* 93* 71* 51*  ?CREATININE 2.91* 2.58* 2.16* 1.77*  ?CALCIUM 8.3* 8.1* 8.0* 7.9*  ? ? ?GFR: ?Estimated Creatinine Clearance: 35.1 mL/min (A) (by C-G formula based on SCr of 1.77 mg/dL (H)). ?Liver Function Tests: ?Recent Labs  ?Lab 12/15/21 ?0149  ?AST 17  ?ALT 17  ?ALKPHOS 26*  ?BILITOT 0.4  ?PROT 4.5*  ?ALBUMIN 2.4*  ? ? ?No results for input(s): LIPASE, AMYLASE in the last 168 hours. ?No results for input(s): AMMONIA in the last 168 hours. ?Coagulation Profile: ?Recent Labs  ?Lab 12/15/21 ?0149  ?INR 1.3*  ? ? ?Cardiac Enzymes: ?No results for input(s): CKTOTAL, CKMB, CKMBINDEX, TROPONINI in the last 168 hours. ?BNP (last 3 results) ?No results for input(s): PROBNP in the last 8760 hours. ?HbA1C: ?No results for input(s): HGBA1C in the last 72 hours. ?CBG: ?No results for input(s): GLUCAP in the last 168 hours. ?Lipid Profile: ?No results for input(s): CHOL, HDL, LDLCALC, TRIG, CHOLHDL, LDLDIRECT in the last 72 hours. ?Thyroid Function Tests: ?No results for input(s): TSH, T4TOTAL, FREET4, T3FREE, THYROIDAB in the last 72 hours. ?Anemia Panel: ?No results for input(s): VITAMINB12, FOLATE, FERRITIN, TIBC, IRON, RETICCTPCT in the last 72 hours. ?Sepsis Labs: ?No results for input(s): PROCALCITON, LATICACIDVEN in the last 168 hours. ? ?No results found for this or any previous visit (from the past 240 hour(s)).  ? ? ? ? ? ?Radiology Studies: ?No results found. ? ? ? ? ? ?Scheduled Meds: ? bisacodyl  10 mg Rectal Once  ? levothyroxine  75 mcg Oral QAC breakfast  ? mouth rinse  15 mL Mouth Rinse BID  ? pantoprazole  40 mg Oral BID  ? polyethylene glycol  17 g Oral Daily  ? tamsulosin  0.8 mg Oral Daily  ? ?Continuous Infusions: ? ? ? ? LOS: 4 days  ? ? ?Time spent: 35  minutes ? ? ? ?Barb Merino, MD ?[facility] ?Pager [phone] ? ?

## 2021-12-19 NOTE — [place] FL2 (Signed)
?[place] MEDICAID FL2 LEVEL OF CARE SCREENING TOOL  ?  ? ?IDENTIFICATION  ?Patient Name: ?[name] Birthdate: 1930-11-14 Sex: male Admission Date (Current Location): ?12/15/2021  ?[place] and [place] Number: ? [place] ?  Facility and Address:  ?[facility], [place] [address] [place], [place] [address] ?     Provider Number: ?[id number]  ?Attending Physician Name and Address:  ?Barb Merino, MD ? Relative Name and Phone Number:  ?  ?   ?Current Level of Care: ?Hospital Recommended Level of Care: ?[place] Prior Approval Number: ?  ? ?Date Approved/Denied: ?  PASRR Number: ?[id number] A ? ?Discharge Plan: ?SNF ?  ? ?Current Diagnoses: ?Patient Active Problem List  ? Diagnosis Date Noted  ? Lung cancer ([place]) 12/15/2021  ? History of allergy to radiographic contrast media 12/15/2021  ? UGIB (upper gastrointestinal bleed) 12/15/2021  ? Acute kidney injury superimposed on chronic kidney disease ([place]) 12/15/2021  ? Malignant neoplasm of right upper lobe of lung ([place]) 05/22/2019  ? Malignant neoplasm of lower lobe of right lung ([place]) 05/22/2019  ? ? ?Orientation RESPIRATION BLADDER Height & Weight   ?  ?Self, Time, Situation, Place ? Normal External catheter, Continent Weight: 217 lb 6 oz (98.6 kg) ?Height:  6' (182.9 cm)  ?BEHAVIORAL SYMPTOMS/MOOD NEUROLOGICAL BOWEL NUTRITION STATUS  ?    Continent Diet (please see discharge summary)  ?AMBULATORY STATUS COMMUNICATION OF NEEDS Skin   ?Limited Assist Verbally   ?  ?  ?  ?    ?     ?     ? ? ?Personal Care Assistance Level of Assistance  ?Bathing, Feeding, Dressing Bathing Assistance: Limited assistance ?Feeding assistance: Independent ?Dressing Assistance: Limited assistance ?   ? ?Functional Limitations Info  ?Sight, Hearing, Speech Sight Info: Adequate ?Hearing Info: Impaired ?Speech Info: Adequate  ? ? ?SPECIAL CARE FACTORS FREQUENCY  ?PT (By licensed PT), OT (By licensed OT)   ?  ?PT Frequency: 5x per week ?OT Frequency: 5x  per week ?  ?  ?  ?   ? ? ?Contractures Contractures Info: Not present  ? ? ?Additional Factors Info  ?Code Status, Allergies Code Status Info: Full ?Allergies Info: Iodinated Contrast ?  ?  ?  ?   ? ?Current Medications (12/19/2021):  This is the current hospital active medication list ?Current Facility-Administered Medications  ?Medication Dose Route Frequency Provider Last Rate Last Admin  ? acetaminophen (TYLENOL) tablet 650 mg  650 mg Oral Q6H PRN Etta Quill, DO   650 mg at 12/15/21 2047  ? Or  ? acetaminophen (TYLENOL) suppository 650 mg  650 mg Rectal Q6H PRN Etta Quill, DO      ? bisacodyl (DULCOLAX) suppository 10 mg  10 mg Rectal Once Barb Merino, MD      ? levothyroxine (SYNTHROID) tablet 75 mcg  75 mcg Oral QAC breakfast Barb Merino, MD   75 mcg at 12/19/21 0431  ? MEDLINE mouth rinse  15 mL Mouth Rinse BID Barb Merino, MD   15 mL at 12/19/21 0808  ? nitroGLYCERIN (NITROSTAT) SL tablet 0.4 mg  0.4 mg Sublingual Q5 min PRN Barb Merino, MD      ? ondansetron (ZOFRAN) tablet 4 mg  4 mg Oral Q6H PRN Etta Quill, DO      ? Or  ? ondansetron (ZOFRAN) injection 4 mg  4 mg Intravenous Q6H PRN Etta Quill, DO      ? pantoprazole (PROTONIX) EC tablet  40 mg  40 mg Oral BID Barb Merino, MD   40 mg at 12/19/21 7026  ? polyethylene glycol (MIRALAX / GLYCOLAX) packet 17 g  17 g Oral Daily Barb Merino, MD   17 g at 12/19/21 3785  ? tamsulosin (FLOMAX) capsule 0.8 mg  0.8 mg Oral Daily Barb Merino, MD   0.8 mg at 12/19/21 8850  ? ? ? ?Discharge Medications: ?Please see discharge summary for a list of discharge medications. ? ?Relevant Imaging Results: ? ?Relevant Lab Results: ? ? ?Additional Information ?SSN [id number] ? ?Vinie Sill, LCSW ? ? ? ? ?

## 2021-12-19 NOTE — TOC Initial Note (Addendum)
Transition of Care (TOC) - Initial/Assessment Note  ? ? ?Patient Details  ?Name: [name] ?MRN: [id number] ?Date of Birth: 1931/04/24 ? ?Transition of Care (TOC) CM/SW Contact:    ?Vinie Sill, LCSW ?Phone Number: ?12/19/2021, 12:56 PM ? ?Clinical Narrative:                 ? ?CSW visit with patient. Patient was resting, CSW spoke with spouse and granddaughter that was in the room. CSW discussed PT/OT recommendation of short term rehab at [facility].  Patient's spouse states because patient has limited mobility, she is unable top provided the level of care needed at this time for the patient to safely discharge home. She believes it is best for the the patient to get stronger at rehab before returning home. She was agreeable to CSW sending out SNF referrals. Preferred SNF is [facility]- CSW explained the SNF process. CSW answered all questions. ? ?TOC will provide the bed offers once available. Sent message to [facility] ?TOC will continue to follow and assist with discharge planning.  ? ?Thurmond Butts, MSW, LCSW ?Clinical Social Worker ? ? ? ?Expected Discharge Plan: [place] ?Barriers to Discharge: Ship broker, SNF Pending bed offer ? ? ?Patient Goals and CMS Choice ?  ?  ?  ? ?Expected Discharge Plan and Services ?Expected Discharge Plan: [place] ?In-house Referral: Clinical Social Work ?  ?  ?  ?                ?  ?  ?  ?  ?  ?  ?  ?  ?  ?  ? ?Prior Living Arrangements/Services ?  ?Lives with:: Self, Spouse ?Patient language and need for interpreter reviewed:: No ?       ?  ?Care giver support system in place?: Yes (comment) ?  ?Criminal Activity/Legal Involvement Pertinent to Current Situation/Hospitalization: No - Comment as needed ? ?Activities of Daily Living ?Home Assistive Devices/Equipment: Eyeglasses ?ADL Screening (condition at time of admission) ?Patient's cognitive ability adequate to safely complete daily activities?: Yes ?Is the patient deaf or have  difficulty hearing?: No ?Does the patient have difficulty seeing, even when wearing glasses/contacts?: No ?Does the patient have difficulty concentrating, remembering, or making decisions?: No ?Patient able to express need for assistance with ADLs?: Yes ?Does the patient have difficulty dressing or bathing?: No ?Independently performs ADLs?: Yes (appropriate for developmental age) ?Does the patient have difficulty walking or climbing stairs?: Yes ?Weakness of Legs: Both ?Weakness of Arms/Hands: None ? ?Permission Sought/Granted ?Permission sought to share information with : Facility Sport and exercise psychologist, Family Supports ?  ?   ?   ?   ?   ? ?Emotional Assessment ?Appearance:: Appears stated age ?Attitude/Demeanor/Rapport: Unable to Assess ?Affect (typically observed): Unable to Assess ?Orientation: : Oriented to Self, Oriented to Place, Oriented to  Time, Oriented to Situation ?Alcohol / Substance Use: Not Applicable ?Psych Involvement: No (comment) ? ?Admission diagnosis:  UGIB (upper gastrointestinal bleed) [K92.2] ?Patient Active Problem List  ? Diagnosis Date Noted  ? Lung cancer ([place]) 12/15/2021  ? History of allergy to radiographic contrast media 12/15/2021  ? UGIB (upper gastrointestinal bleed) 12/15/2021  ? Acute kidney injury superimposed on chronic kidney disease ([place]) 12/15/2021  ? Malignant neoplasm of right upper lobe of lung ([place]) 05/22/2019  ? Malignant neoplasm of lower lobe of right lung ([place]) 05/22/2019  ? ?PCP:  Bonnita Nasuti, MD ?Pharmacy:   ?[place] [place] [address] ?Phone: [phone] Fax: [phone] ? ? ? ? ?Social Determinants of Health (SDOH) Interventions ?  ? ?Readmission Risk Interventions ?   ? View : No data to display.  ?  ?  ?  ? ? ? ?

## 2021-12-19 NOTE — Plan of Care (Signed)
?  Problem: Education: ?Goal: Ability to identify signs and symptoms of gastrointestinal bleeding will improve ?Outcome: Progressing ?  ?Problem: Bowel/Gastric: ?Goal: Will show no signs and symptoms of gastrointestinal bleeding ?Outcome: Progressing ?  ?Problem: Clinical Measurements: ?Goal: Ability to maintain clinical measurements within normal limits will improve ?Outcome: Progressing ?Goal: Will remain free from infection ?Outcome: Progressing ?Goal: Diagnostic test results will improve ?Outcome: Progressing ?Goal: Respiratory complications will improve ?Outcome: Progressing ?Goal: Cardiovascular complication will be avoided ?Outcome: Progressing ?  ?Problem: Activity: ?Goal: Risk for activity intolerance will decrease ?Outcome: Progressing ?  ?Problem: Nutrition: ?Goal: Adequate nutrition will be maintained ?Outcome: Progressing ?  ?Problem: Skin Integrity: ?Goal: Risk for impaired skin integrity will decrease ?Outcome: Progressing ?  ?

## 2021-12-20 DIAGNOSIS — K922 Gastrointestinal hemorrhage, unspecified: Secondary | ICD-10-CM | POA: Diagnosis not present

## 2021-12-20 LAB — COMPREHENSIVE METABOLIC PANEL
ALT: 35 U/L (ref 0–44)
AST: 19 U/L (ref 15–41)
Albumin: 2.1 g/dL — ABNORMAL LOW (ref 3.5–5.0)
Alkaline Phosphatase: 39 U/L (ref 38–126)
Anion gap: 6 (ref 5–15)
BUN: 32 mg/dL — ABNORMAL HIGH (ref 8–23)
CO2: 22 mmol/L (ref 22–32)
Calcium: 7.8 mg/dL — ABNORMAL LOW (ref 8.9–10.3)
Chloride: 116 mmol/L — ABNORMAL HIGH (ref 98–111)
Creatinine, Ser: 1.51 mg/dL — ABNORMAL HIGH (ref 0.61–1.24)
GFR, Estimated: 44 mL/min — ABNORMAL LOW (ref >60)
Glucose, Bld: 108 mg/dL — ABNORMAL HIGH (ref 70–99)
Potassium: 3.7 mmol/L (ref 3.5–5.1)
Sodium: 144 mmol/L (ref 135–145)
Total Bilirubin: 0.4 mg/dL (ref 0.3–1.2)
Total Protein: 4.2 g/dL — ABNORMAL LOW (ref 6.5–8.1)

## 2021-12-20 LAB — CBC WITH DIFFERENTIAL/PLATELET
Abs Immature Granulocytes: 0.17 10*3/uL — ABNORMAL HIGH (ref 0.00–0.07)
Basophils Absolute: 0 10*3/uL (ref 0.0–0.1)
Basophils Relative: 0 %
Eosinophils Absolute: 0.1 10*3/uL (ref 0.0–0.5)
Eosinophils Relative: 2 %
HCT: 24.8 % — ABNORMAL LOW (ref 39.0–52.0)
Hemoglobin: 8 g/dL — ABNORMAL LOW (ref 13.0–17.0)
Immature Granulocytes: 2 %
Lymphocytes Relative: 10 %
Lymphs Abs: 1 10*3/uL (ref 0.7–4.0)
MCH: 30.5 pg (ref 26.0–34.0)
MCHC: 32.3 g/dL (ref 30.0–36.0)
MCV: 94.7 fL (ref 80.0–100.0)
Monocytes Absolute: 1 10*3/uL (ref 0.1–1.0)
Monocytes Relative: 10 %
Neutro Abs: 7.3 10*3/uL (ref 1.7–7.7)
Neutrophils Relative %: 76 %
Other: DECREASED %
Platelets: 135 10*3/uL — ABNORMAL LOW (ref 150–400)
RBC: 2.62 MIL/uL — ABNORMAL LOW (ref 4.22–5.81)
RDW: 19.7 % — ABNORMAL HIGH (ref 11.5–15.5)
WBC: 9.6 10*3/uL (ref 4.0–10.5)
nRBC: 0 % (ref 0.0–0.2)

## 2021-12-20 LAB — PATHOLOGIST SMEAR REVIEW

## 2021-12-20 LAB — MAGNESIUM: Magnesium: 1.9 mg/dL (ref 1.7–2.4)

## 2021-12-20 LAB — PHOSPHORUS: Phosphorus: 3.1 mg/dL (ref 2.5–4.6)

## 2021-12-20 MED ORDER — PANTOPRAZOLE SODIUM 40 MG PO TBEC: 40.0000 mg | DELAYED_RELEASE_TABLET | Freq: Two times a day (BID) | ORAL | Status: AC

## 2021-12-20 NOTE — Progress Notes (Addendum)
Physical Therapy Treatment ?Patient Details ?Name: [name] ?MRN: [id number] ?DOB: March 03, 1931 ?Today's Date: 12/20/2021 ? ? ?History of Present Illness Pt is an 88 y.o. male who presented 12/15/21 for embolization of GI bleed. PMH significant for lung CA with lobectomy, HTN, OSA and CKD stage III ? ?  ?PT Comments  ? ? Patient progressing well towards PT goals. Session focused on gait training. Tolerated gait training with Min guard assist and use of RW for support. Noted to have 2/4 DOE and needed seated rest break due to fatigue/weakness. VSS on RA with activity. Requires Min A for transfers at this time. Motivated to mobilize and return to PLOF. Tolerated there ex sitting EOB. Continues to be appropriate for post acute rehab due to weakness, decreased endurance and safety. Will follow. ?  ?Recommendations for follow up therapy are one component of a multi-disciplinary discharge planning process, led by the attending physician.  Recommendations may be updated based on patient status, additional functional criteria and insurance authorization. ? ?Follow Up Recommendations ? Skilled nursing-short term rehab (<3 hours/day) ?  ?  ?Assistance Recommended at Discharge Intermittent Supervision/Assistance  ?Patient can return home with the following A little help with bathing/dressing/bathroom;Assistance with cooking/housework;Assist for transportation;Help with stairs or ramp for entrance;A little help with walking and/or transfers ?  ?Equipment Recommendations ? Rolling walker (2 wheels);BSC/3in1  ?  ?Recommendations for Other Services   ? ? ?  ?Precautions / Restrictions Precautions ?Precautions: Fall ?Restrictions ?Weight Bearing Restrictions: No  ?  ? ?Mobility ? Bed Mobility ?Overal bed mobility: Needs Assistance ?Bed Mobility: Supine to Sit ?  ?  ?Supine to sit: Supervision, HOB elevated ?  ?  ?General bed mobility comments: Able to pull self up into long sitting with use of rails, no assist needed. ?   ? ?Transfers ?Overall transfer level: Needs assistance ?Equipment used: Rolling walker (2 wheels) ?Transfers: Sit to/from Stand ?Sit to Stand: Min assist ?  ?  ?  ?  ?  ?General transfer comment: Min A to power to standing from EOB x1 with cues for hand placement. Stood from Youth worker.Transferred to chair post ambulation. ?  ? ?Ambulation/Gait ?Ambulation/Gait assistance: Min guard ?Gait Distance (Feet): 42 Feet (20' + 22') ?Assistive device: Rolling walker (2 wheels) ?Gait Pattern/deviations: Step-through pattern, Decreased stride length, Trunk flexed ?Gait velocity: reduced ?  ?  ?General Gait Details: Slow, guarded gait with decreased step lengths bilaterally; 1 seated rest break. 2/4 DOE. VSS on RA. ? ? ?Stairs ?  ?  ?  ?  ?  ? ? ?Wheelchair Mobility ?  ? ?Modified Rankin (Stroke Patients Only) ?  ? ? ?  ?Balance Overall balance assessment: Needs assistance ?Sitting-balance support: Feet supported, No upper extremity supported ?Sitting balance-Leahy Scale: Good ?  ?  ?Standing balance support: During functional activity, Reliant on assistive device for balance ?Standing balance-Leahy Scale: Poor ?Standing balance comment: Reliant on RW ?  ?  ?  ?  ?  ?  ?  ?  ?  ?  ?  ?  ? ?  ?Cognition Arousal/Alertness: Awake/alert ?Behavior During Therapy: [facility] for tasks assessed/performed ?Overall Cognitive Status: Within Functional Limits for tasks assessed ?  ?  ?  ?  ?  ?  ?  ?  ?  ?  ?  ?  ?  ?  ?  ?  ?General Comments: HOH so needs repetition at times ?  ?  ? ?  ?Exercises General Exercises - Lower Extremity ?[place]  Quad: AROM, Strengthening, Both, 20 reps, Seated ?Hip Flexion/Marching: AROM, Strengthening, Both, 10 reps, Seated ? ?  ?General Comments General comments (skin integrity, edema, etc.): Wife and daughter present during session. BP pre activity 137/82, post activity BP141/65 ?  ?  ? ?Pertinent Vitals/Pain Pain Assessment ?Pain Assessment: Faces ?Faces Pain Scale: Hurts a little bit ?Pain Location:  abdomen ?Pain Descriptors / Indicators: Discomfort ?Pain Intervention(s): Monitored during session, Repositioned  ? ? ?Home Living   ?  ?  ?  ?  ?  ?  ?  ?  ?  ?   ?  ?Prior Function    ?  ?  ?   ? ?PT Goals (current goals can now be found in the care plan section) Progress towards PT goals: Progressing toward goals ? ?  ?Frequency ? ? ? Min 3X/week ? ? ? ?  ?PT Plan Current plan remains appropriate  ? ? ?Co-evaluation   ?  ?  ?  ?  ? ?  ?AM-PAC PT "6 Clicks" Mobility   ?Outcome Measure ? Help needed turning from your back to your side while in a flat bed without using bedrails?: A Little ?Help needed moving from lying on your back to sitting on the side of a flat bed without using bedrails?: A Little ?Help needed moving to and from a bed to a chair (including a wheelchair)?: A Little ?Help needed standing up from a chair using your arms (e.g., wheelchair or bedside chair)?: A Little ?Help needed to walk in hospital room?: A Little ?Help needed climbing 3-5 steps with a railing? : A Lot ?6 Click Score: 17 ? ?  ?End of Session Equipment Utilized During Treatment: Gait belt ?Activity Tolerance: Patient tolerated treatment well ?Patient left: in chair;with call bell/phone within reach;with chair alarm set;with family/visitor present ?Nurse Communication: Mobility status ?PT Visit Diagnosis: Unsteadiness on feet (R26.81);Other abnormalities of gait and mobility (R26.89);Muscle weakness (generalized) (M62.81);Difficulty in walking, not elsewhere classified (R26.2);Pain ?Pain - part of body:  (abdomen) ?  ? ? ?Time: 2395-3202 ?PT Time Calculation (min) (ACUTE ONLY): 32 min ? ?Charges:  $Gait Training: 8-22 mins ?$Therapeutic Activity: 8-22 mins          ?          ? ?Marisa Severin, PT, DPT ?Acute Rehabilitation Services ?Secure chat preferred ?Office [phone] ? ? ? ? ? ?[place] ?12/20/2021, 11:46 AM ? ?

## 2021-12-20 NOTE — TOC Progression Note (Signed)
Transition of Care (TOC) - Progression Note  ? ? ?Patient Details  ?Name: [name] ?MRN: [id number] ?Date of Birth: [birth year] ? ?Transition of Care (TOC) CM/SW Contact  ?Vinie Sill, LCSW ?Phone Number: ?12/20/2021, 12:00 PM ? ?Clinical Narrative:    ? ?Insurance remains pending  ? ?Expected Discharge Plan: [place] ?Barriers to Discharge: Insurance Authorization ? ?Expected Discharge Plan and Services ?Expected Discharge Plan: [place] ?In-house Referral: Clinical Social Work ?  ?  ?  ?                ?  ?  ?  ?  ?  ?  ?  ?  ?  ?  ? ? ?Social Determinants of Health (SDOH) Interventions ?  ? ?Readmission Risk Interventions ?   ? View : No data to display.  ?  ?  ?  ? ? ?

## 2021-12-20 NOTE — Progress Notes (Signed)
Report called and given to Salley Hews at [facility]. ?

## 2021-12-20 NOTE — Plan of Care (Signed)
?  Problem: Education: ?Goal: Ability to identify signs and symptoms of gastrointestinal bleeding will improve ?Outcome: Adequate for Discharge ?  ?Problem: Bowel/Gastric: ?Goal: Will show no signs and symptoms of gastrointestinal bleeding ?Outcome: Adequate for Discharge ?  ?Problem: Fluid Volume: ?Goal: Will show no signs and symptoms of excessive bleeding ?Outcome: Adequate for Discharge ?  ?Problem: Education: ?Goal: Knowledge of General Education information will improve ?Description: Including pain rating scale, medication(s)/side effects and non-pharmacologic comfort measures ?Outcome: Adequate for Discharge ?  ?Problem: Pain Managment: ?Goal: General experience of comfort will improve ?Outcome: Adequate for Discharge ?  ?Problem: Safety: ?Goal: Ability to remain free from injury will improve ?Outcome: Adequate for Discharge ?  ?Problem: Skin Integrity: ?Goal: Risk for impaired skin integrity will decrease ?Outcome: Adequate for Discharge ?  ?

## 2021-12-20 NOTE — TOC Transition Note (Signed)
Transition of Care (TOC) - CM/SW Discharge Note ? ? ?Patient Details  ?Name: PRITHVI KOOI ?MRN: [id number] ?Date of Birth: 26-Feb-1931 ? ?Transition of Care (TOC) CM/SW Contact:  ?Vinie Sill, LCSW ?Phone Number: ?12/20/2021, 2:48 PM ? ? ?Clinical Narrative:    ? ?Received insurance approval # [id number]  ?Approval dates: 05/2-05/4 ? ?Patient will Discharge to: [place] ?Discharge Date: 12/20/2021 ?Family Notified: granddaughter  ?Transport [id number] ? ?Per MD patient is ready for discharge. RN, patient, and facility notified of discharge. Discharge Summary sent to facility. RN given number for [phone]., Room 105. Ambulance transport requested for patient.  ? ?Clinical Social Worker signing off. ? ?Thurmond Butts, MSW, LCSW ?Clinical Social Worker ? ? ? ? ?Final next level of care: [place] ?Barriers to Discharge: Barriers Resolved ? ? ?Patient Goals and CMS Choice ?  ?  ?  ? ?Discharge Placement ?  ?           ?Patient chooses bed at: [place], [place] ?Patient to be transferred to facility by: [facility] ?Name of family member notified: graddaughter ?Patient and family notified of of transfer: 12/20/21 ? ?Discharge Plan and Services ?In-house Referral: Clinical Social Work ?  ?           ?  ?  ?  ?  ?  ?  ?  ?  ?  ?  ? ?Social Determinants of Health (SDOH) Interventions ?  ? ? ?Readmission Risk Interventions ?   ? View : No data to display.  ?  ?  ?  ? ? ? ? ? ?

## 2021-12-20 NOTE — Discharge Summary (Signed)
Physician Discharge Summary  ?[name] MLY:[id number] DOB: 07-03-1931 DOA: 12/15/2021 ? ?PCP: Bonnita Nasuti, MD ? ?Admit date: 12/15/2021 ?Discharge date: 12/20/2021 ? ?Admitted From: Home and [facility] ?Disposition: Skilled nursing facility ? ?Recommendations for Outpatient Follow-up:  ?Follow up with PCP in 1-2 weeks ?Please obtain BMP/CBC in one week ?Schedule follow-up with gastroenterology. ? ?Home Health: N/A ?Equipment/Devices: N/A ? ?Discharge Condition: Fair ?CODE STATUS: Full code ?Diet recommendation: Regular diet.  Nutritional supplements. ? ?Discharge summary: ?88 year old gentleman with history of recurrent lung cancer treated with lobectomy and subsequent radiation, hypertension, stage IIIb CKD with baseline creatinine about 1.8 admitted with GI bleeding. ?Admitted to [facility] with GI bleeding and acute kidney injury.  Transferred to [facility] with evidence of ongoing GI bleeding for IR procedure.  He stayed in the hospital for prolonged.  Plan of care as below: ? ?Plan of care: ?  ?Acute upper GI bleeding, acute blood loss anemia: ?EGD 4/23, active bleeding from the stomach and questionable Dieulafoy lesion, clipped and cauterized ?EGD 4/25, second site of bleeding from a small duodenal ulcer in the distal duodenal bulb treated with sclerotherapy ?Colonoscopy 4/25, full of blood. No bleeding points. ?Total blood transfusions-7 units PRBC and 1 unit FFP at [facility]. ?Hemoglobin 8.5-8.3-8-7.9-8 now.  No clinical evidence of ongoing bleeding.   ?Angiogram and gastroduodenal artery embolization by IR 4/27. ?Oral Protonix now and on discharge. ?Regular diet.  MiraLAX daily. ?  ?Acute kidney injury superimposed on chronic kidney disease stage IIIb: Hypernatremia. ?Recent baseline creatinine of 1.8 in 2/23 ?Multifactorial acute kidney injury.  No evidence of uremia. ?Creatinine 2.91-2.58-2.1-1.77-1.55.  Able to maintain without additional IV fluids.  Back to his  baseline. ?Patient should be able to resume Aldactone for fluid balance.  Potassium is adequate. ?  ?Essential hypertension: Blood pressures remained stable.  Will resume Aldactone, however will discontinue losartan hydrochlorothiazide as he is not needing any blood pressure medications at this time. ?  ?Hyperlipidemia: Resume statins.   ? ?BPH: Flomax.  No retention. ? ?Coronary artery disease: Stopped bleeding so we will put him back on aspirin 81 mg daily along with Protonix support.  He is on nitroglycerin as needed.  Stable. ?  ?Deconditioning/debility: Work with PT OT.  Very debilitated.  He will need short-term rehab at a skilled nursing facility before going home. ?Transfer to SNF when bed available. ? ? ?Discharge Diagnoses:  ?Principal Problem: ?  UGIB (upper gastrointestinal bleed) ?Active Problems: ?  Acute kidney injury superimposed on chronic kidney disease ([place]) ?  History of allergy to radiographic contrast media ?  Lung cancer ([place]) ? ? ? ?Discharge Instructions ? ?Discharge Instructions   ? ? Call MD for:  difficulty breathing, headache or visual disturbances   Complete by: As directed ?  ? Call MD for:  extreme fatigue   Complete by: As directed ?  ? Call MD for:  persistant nausea and vomiting   Complete by: As directed ?  ? Diet general   Complete by: As directed ?  ? Increase activity slowly   Complete by: As directed ?  ? No wound care   Complete by: As directed ?  ? ?  ? ?Allergies as of 12/20/2021   ? ?   Reactions  ? Iodinated Contrast Media Hives  ? ?  ? ?  ?Medication List  ?  ? ?STOP taking these medications   ? ?losartan-hydrochlorothiazide 100-25 MG tablet ?Commonly known as: HYZAAR ?  ? ?  ? ?  TAKE these medications   ? ?aspirin EC 81 MG tablet ?Take 81 mg by mouth daily. ?  ?Fish Oil 1200 MG Cpdr ?Take 1,200 mg by mouth daily. ?  ?Glucosamine 500 MG Caps ?Take 500 mg by mouth daily. ?  ?levothyroxine 50 MCG tablet ?Commonly known as: SYNTHROID ?Take 75 mcg by mouth daily before  breakfast. Taking 1 & 1/2 tablets daily ?  ?lovastatin 40 MG tablet ?Commonly known as: MEVACOR ?Take 40 mg by mouth at bedtime. ?  ?Multi-Vitamin tablet ?Take 1 tablet by mouth daily. ?  ?nitroGLYCERIN 0.4 MG SL tablet ?Commonly known as: NITROSTAT ?Place 0.4 mg under the tongue every 5 (five) minutes as needed for chest pain. ?  ?pantoprazole 40 MG tablet ?Commonly known as: PROTONIX ?Take 1 tablet (40 mg total) by mouth 2 (two) times daily. ?  ?polyethylene glycol 17 g packet ?Commonly known as: MIRALAX / GLYCOLAX ?Take 17 g by mouth daily. ?  ?pregabalin 75 MG capsule ?Commonly known as: LYRICA ?Take 75 mg by mouth 2 (two) times daily. ?  ?spironolactone 25 MG tablet ?Commonly known as: ALDACTONE ?Take 37.5 mg by mouth daily. Taking 1 & 1/2 tablet(s) daily ?  ?tamsulosin 0.4 MG Caps capsule ?Commonly known as: FLOMAX ?Take 0.8 mg by mouth daily. ?  ? ?  ? ? ?Allergies  ?Allergen Reactions  ? Iodinated Contrast Media Hives  ? ? ?Consultations: ?Interventional radiology ? ? ?Procedures/Studies: ?IR Angiogram Visceral Selective ? ?Result Date: 12/15/2021 ?INDICATION: 88 year old with upper GI bleed. Patient had recent endoscopic treatments for a stomach lesion and duodenal bulb ulcer. There is concern for ongoing bleeding at the duodenal bulb and patient was transferred to [facility] for embolization. Patient has acute kidney injury and history of contrast allergy. Patient was given 13 hour premedications for the contrast allergy. Plan for empiric embolization of the gastroduodenal artery based on the endoscopic findings and inability to perform significant diagnostic angiography due to patient's poor renal function. EXAM: 1. Mesenteric arteriogram 2. Coil embolization of gastroduodenal artery 3. Ultrasound guidance for vascular access MEDICATIONS: Moderate sedation ANESTHESIA/SEDATION: Moderate (conscious) sedation was employed during this procedure. A total of Versed 1.0 mg and Fentanyl 25 mcg was  administered intravenously by the radiology nurse. Total intra-service moderate Sedation Time: 54 minutes. The patient's level of consciousness and vital signs were monitored continuously by radiology nursing throughout the procedure under my direct supervision. CONTRAST:  25 mL Omnipaque 300 FLUOROSCOPY: Radiation Exposure Index (as provided by the fluoroscopic device): 836 mGy Kerma COMPLICATIONS: None immediate. PROCEDURE: Informed consent was obtained from the patient following explanation of the procedure, risks, benefits and alternatives. The patient and family understands, agrees and consents for the procedure. All questions were addressed. A time out was performed prior to the initiation of the procedure. Patient was placed supine on the interventional table. Ultrasound confirmed a patent right common femoral artery. Ultrasound image was saved for documentation. Right groin was prepped and draped in sterile fashion. Maximal barrier sterile technique was utilized including caps, mask, sterile gowns, sterile gloves, sterile drape, hand hygiene and skin antiseptic. Right groin was anesthetized using 1% lidocaine. Small incision was made. Using ultrasound guidance, 21 gauge needle was directed in the right common femoral artery and micropuncture dilator set was placed. Five French vascular sheath was placed. C2 catheter was used to cannulate the celiac artery. Contrast injection was performed in the celiac artery. STC Renegade microcatheter was advanced into the common hepatic artery and angiography was performed. The microcatheter was  advanced into the gastroduodenal artery and additional angiography was performed. The catheter was placed in the distal gastroduodenal artery and coil embolization was performed using combination of 5 mm and 6 mm Music therapist and IDC coils. Follow-up angiograms confirmed occlusion of the gastroduodenal artery. Microcatheter and C2 catheter were removed. Angiogram  was performed through the right groin sheath. Right groin sheath was removed using the ExoSeal closure device. Right groin hemostasis. Bandage placed over the puncture site. FINDINGS: Celiac artery was patent. Gastroduodenal artery w

## 2021-12-20 NOTE — Progress Notes (Signed)
Mobility Specialist Progress Note ? ? 12/20/21 1235  ?Mobility  ?Activity Transferred from chair to bed  ?Level of Assistance Moderate assist, patient does 50-74%  ?Assistive Device Front wheel walker  ?Distance Ambulated (ft) 2 ft  ?Activity Response Tolerated well  ?$Mobility charge 1 Mobility  ? ?Tx'd limited to hearing deficit and requiring assistance from family members. ModA to stand but contact guard for remainder of tx w/ heavy visual cues accompanied. Returned to bed w/o fault and left call bell by side. ? ?Holland Falling ?Mobility Specialist ?Phone Number [phone] ? ?

## 2022-01-13 ENCOUNTER — Telehealth: Payer: Self-pay | Admitting: *Deleted

## 2022-01-13 NOTE — Telephone Encounter (Signed)
Called patient to inform of Ct for 01-20-22- arrival time- 2:30 pm @ [facility], no restrictions to test, patient to receive results from [facility] on 01-23-22 @ 2:30. pm for results via telephone, spoke with patient and he is aware of these appts. and the instructions

## 2022-01-17 ENCOUNTER — Ambulatory Visit: Payer: Medicare HMO | Admitting: Radiation Oncology

## 2022-01-23 ENCOUNTER — Encounter: Payer: Self-pay | Admitting: Radiation Oncology

## 2022-01-23 ENCOUNTER — Ambulatory Visit
Admission: RE | Admit: 2022-01-23 | Discharge: 2022-01-23 | Disposition: A | Discharge status: Home / Self Care | Payer: Medicare HMO | Source: Ambulatory Visit | Attending: Radiation Oncology | Admitting: Radiation Oncology

## 2022-01-23 DIAGNOSIS — C3431 Malignant neoplasm of lower lobe, right bronchus or lung: Secondary | ICD-10-CM

## 2022-01-23 DIAGNOSIS — C3411 Malignant neoplasm of upper lobe, right bronchus or lung: Secondary | ICD-10-CM

## 2022-01-23 NOTE — Addendum Note (Signed)
Encounter addended by: Hayden Pedro, PA-C on: 01/23/2022 2:38 PM  Actions taken: Flowsheet accepted

## 2022-01-23 NOTE — Progress Notes (Addendum)
Telephone appointment. I verified patient's identity and began nursing interview. Patient reports doing well. Patient denies chest pains, SOB or urinary discomfort. No issues reported at this time.  Meaningful use complete. I-PSS score of 5-mild Flomax 0.4mg  as directed. Urology appointment 6/285/23-per patient.  Reminded patient of his 2:30pm-01/23/22 telephone appointment w/ Shona Simpson PA-C. I left my extension [phone] in case patient needs anything.  Patient contact [phone]

## 2022-01-23 NOTE — Progress Notes (Signed)
Radiation Oncology         [phone] ________________________________  Outpatient Follow Up - Conducted via telephone due to current COVID-19 concerns for limiting patient exposure  I spoke with the patient to conduct this consult visit via telephone to spare the patient unnecessary potential exposure in the healthcare setting during the current COVID-19 pandemic. The patient was notified in advance and was offered a [place] meeting to allow for face to face communication but unfortunately reported that they did not have the appropriate resources/technology to support such a visit and instead preferred to proceed with a telephone visit.  Name: [name] MRN: [id number]  Date of Service: 01/23/2022  DOB: 12-02-1930  Diagnosis:   Putative Stage IA1, cT1aN0M0 NSCLC of the RLL  Prior Radiation:     06/10/2019-06/17/2019 SBRT Treatment:  The RLL target was treated to 54 Gy in 3 fractions  NARRATIVE: [name] is a pleasant 88 y.o. gentleman with a history of a stage I non-small cell lung cancer who underwent lobectomy with Dr. Arlyce Dice in 2002 . Pathology is no longer available for review in the epic system. He apparently had a local recurrence which prompted re-excision of the chest wall requiring prostethetic rib placement. He was followed for many years in surveillance without recurrence.  He developed a new nodule in the right lower lobe in the summer 2020 and PET scan in August 2020 revealed a 1 cm nodule in the right lower lobe with an SUV of 3.2, a right upper lobe nodule measuring 6 mm with an SUV of 1.6, and a 7 mm nodule too low for PET detection.  Given his age and comorbidities he was not a candidate for surgical resection or biopsy and proceeded with treatment of a putative stage Ia non-small cell lung cancer of the right lower lobe in October 2020.  He tolerated treatment well.  He went for a posttreatment CT on 07/28/2019 that revealed stability in the right lung over the treatment  site. There appeared to be a dictation error stating it was the RUL, however on further imaging review, it is in the RLL corresponding to the PET scan prior to his therapy see below. The other two lesions in the RUL were evaluated and the 9 mm lesion was stable, and the 6 mm lesion previously was measured at 36mm.    He has been followed in surveillance since this time with stigmata of atherosclerotic disease of the coronary and aortic vessels, emphysema, cystic changes of the adrenal glands.  CT scan of the chest on 01/20/2022 showed postop changes again in the right middle and lower lobe sites, moderate centrilobular emphysema a 6 mm left lower lobe nodule stable a 7 mm groundglass nodule in the posterior right upper lobe that was stable, no new pulmonary nodules or masses were identified and posttreatment related changes otherwise noted.  No mediastinal this mass was present. He's contacted to review these results today.   On review of systems, the patinet is doing well overall. He reports he has been working on physical strength after having a hospitalization a little over a month ago for a GI bleed treated with endoscopic sclerotherapy and acute on chronic renal failure. He is going to follow up with his urologist for BPH and urinary frequency. His IPSS score noted by nursing was 5 today and he continues taking Flomax. He denies any chest pain, shortness of breath or productive cough. No other complaints are verbalized. He admits he feels pretty well for  his age.    PAST MEDICAL HISTORY:  Past Medical History:  Diagnosis Date   Arthritis    Gastroesophageal reflux    Hypercholesteremia    Hypertension    Kidney stones    Lung cancer ([place]) 2002-2003   Melanoma ([place]) 2019   Bilateral arms   Obstructive sleep apnea     PAST SURGICAL HISTORY: Past Surgical History:  Procedure Laterality Date   CHEST WALL TUMOR EXCISION Left 2003   EXCISIONAL HEMORRHOIDECTOMY  2019   IR ANGIOGRAM SELECTIVE  EACH ADDITIONAL VESSEL  12/15/2021   IR ANGIOGRAM VISCERAL SELECTIVE  12/15/2021   IR EMBO ART  VEN HEMORR LYMPH EXTRAV  INC GUIDE ROADMAPPING  12/15/2021   IR US GUIDE VASC ACCESS RIGHT  12/15/2021   LOBECTOMY Right 2002   Lower lobe    PAST SOCIAL HISTORY:  Social History   Socioeconomic History   Marital status: Married    Spouse name: Not on file   Number of children: Not on file   Years of education: Not on file   Highest education level: Not on file  Occupational History   Occupation: retired    Comment: previous Furniture conservator/restorer  Tobacco Use   Smoking status: Former    Packs/day: 1.00    Types: Cigarettes   Smokeless tobacco: Never   Tobacco comments:    Smoked between [employer]  Substance and Sexual Activity   Alcohol use: Not Currently   Drug use: Not on file   Sexual activity: Not on file  Other Topics Concern   Not on file  Social History Narrative   Not on file   Social Determinants of Health   Financial Resource Strain: Not on file  Food Insecurity: Not on file  Transportation Needs: Not on file  Physical Activity: Not on file  Stress: Not on file  Social Connections: Not on file  Intimate Partner Violence: Not on file  The patient is married and lives in [place]. He still enjoys mowing his yard and being active in his church.  PAST FAMILY HISTORY: Family History  Problem Relation Age of Onset   Colon cancer Father    Lung cancer Sister    Breast cancer Daughter     MEDICATIONS  Current Outpatient Medications  Medication Sig Dispense Refill   aspirin EC 81 MG tablet Take 81 mg by mouth daily.     Glucosamine 500 MG CAPS Take 500 mg by mouth daily.     levothyroxine (SYNTHROID) 50 MCG tablet Take 75 mcg by mouth daily before breakfast. Taking 1 & 1/2 tablets daily     lovastatin (MEVACOR) 40 MG tablet Take 40 mg by mouth at bedtime.     Multiple Vitamin (MULTI-VITAMIN) tablet Take 1 tablet by mouth daily.     nitroGLYCERIN (NITROSTAT) 0.4 MG SL  tablet Place 0.4 mg under the tongue every 5 (five) minutes as needed for chest pain.     Omega-3 Fatty Acids (FISH OIL) 1200 MG CPDR Take 1,200 mg by mouth daily.     pantoprazole (PROTONIX) 40 MG tablet Take 1 tablet (40 mg total) by mouth 2 (two) times daily.     polyethylene glycol (MIRALAX / GLYCOLAX) 17 g packet Take 17 g by mouth daily.     pregabalin (LYRICA) 75 MG capsule Take 75 mg by mouth 2 (two) times daily.     spironolactone (ALDACTONE) 25 MG tablet Take 37.5 mg by mouth daily. Taking 1 & 1/2 tablet(s) daily     tamsulosin (FLOMAX)  0.4 MG CAPS capsule Take 0.8 mg by mouth daily.     No current facility-administered medications for this encounter.    ALLERGIES:  Allergies  Allergen Reactions   Iodinated Contrast Media Hives    PHYSICAL EXAM: Unable to assess due to encounter type.   IMPRESSION/PLAN: 1. Putative Stage IA1, cT1aN0M0 NSCLC of the RLL. The patient continues to be doing well without symptoms or imaging findings concernign for disease. He desires continued surveillance as long as medically he is doing well enough to tolerate having scans, and if new disease was noted, receiving treatment for this. Again, we will plan for repeat CT imaging in 6 months per NCCN guidelines. 2.         RUL and LLL nodules. The two previously noted changes were described as one in the RUL in May 2022 and since have been described as single, and stable and will be followed expectantly. The LLL nodule also is stable and we will follow this as well. 3.         Remote history of Stage I, NSCLC of the RLL. As per #1. He remains NED. This will continue to be followed expectantly on subsequent surveillance scans. 4. Thyroid nodule. This has not been commented on since 2021. He will follow with his medical doctor and we will not changes on subsequent imaging if necessary.    Given current concerns for patient exposure during the COVID-19 pandemic, this encounter was conducted via telephone.   The patient has provided two factor identification and has given verbal consent for this type of encounter and has been advised to only accept a meeting of this type in a secure network environment. The time spent during this encounter was 35 minutes including preparation, discussion, and coordination of the patient's care. The attendants for this meeting [place]  and Silver Huguenin.  During the encounter, Hayden Pedro was located at [facility] Radiation Oncology Department.  Silver Huguenin was located at home.     Carola Rhine, PAC

## 2022-07-11 ENCOUNTER — Telehealth: Payer: Self-pay | Admitting: *Deleted

## 2022-07-11 NOTE — Telephone Encounter (Signed)
CALLED PATIENT TO INFORM OF CT FOR 07-25-22 - ARRIVAL TIME - 12:30 PM @ [place] NO RESTRICTIONS TO TEST, PATIENT TO RECEIVE RESULTS FROM [facility] VIA TELEPHONE ON 07-31-22 @ 1 PM, LVM FOR A RETURN CALL

## 2022-07-21 DIAGNOSIS — E038 Other specified hypothyroidism: Secondary | ICD-10-CM | POA: Diagnosis not present

## 2022-07-21 DIAGNOSIS — N401 Enlarged prostate with lower urinary tract symptoms: Secondary | ICD-10-CM | POA: Diagnosis not present

## 2022-07-25 DIAGNOSIS — I700 Atherosclerosis of aorta: Secondary | ICD-10-CM | POA: Diagnosis not present

## 2022-07-25 DIAGNOSIS — J439 Emphysema, unspecified: Secondary | ICD-10-CM | POA: Diagnosis not present

## 2022-07-25 DIAGNOSIS — C3490 Malignant neoplasm of unspecified part of unspecified bronchus or lung: Secondary | ICD-10-CM | POA: Diagnosis not present

## 2022-07-25 DIAGNOSIS — R918 Other nonspecific abnormal finding of lung field: Secondary | ICD-10-CM | POA: Diagnosis not present

## 2022-07-25 DIAGNOSIS — J432 Centrilobular emphysema: Secondary | ICD-10-CM | POA: Diagnosis not present

## 2022-07-25 DIAGNOSIS — D3502 Benign neoplasm of left adrenal gland: Secondary | ICD-10-CM | POA: Diagnosis not present

## 2022-07-25 DIAGNOSIS — C3411 Malignant neoplasm of upper lobe, right bronchus or lung: Secondary | ICD-10-CM | POA: Diagnosis not present

## 2022-07-31 ENCOUNTER — Ambulatory Visit
Admission: RE | Admit: 2022-07-31 | Discharge: 2022-07-31 | Disposition: A | Discharge status: Home / Self Care | Payer: Medicare HMO | Source: Ambulatory Visit | Attending: Radiation Oncology | Admitting: Radiation Oncology

## 2022-07-31 ENCOUNTER — Encounter: Payer: Self-pay | Admitting: Radiation Oncology

## 2022-07-31 DIAGNOSIS — Z87891 Personal history of nicotine dependence: Secondary | ICD-10-CM | POA: Diagnosis not present

## 2022-07-31 DIAGNOSIS — C3411 Malignant neoplasm of upper lobe, right bronchus or lung: Secondary | ICD-10-CM

## 2022-07-31 DIAGNOSIS — C3431 Malignant neoplasm of lower lobe, right bronchus or lung: Secondary | ICD-10-CM

## 2022-07-31 NOTE — Progress Notes (Signed)
Radiation Oncology         [phone] ________________________________   Outpatient Follow Up - Conducted via telephone at patient request.  I spoke with the patient to conduct this consult visit via telephone. The patient was notified in advance and was offered an in person or telemedicine meeting to allow for face to face communication but instead preferred to proceed with a telephone consult.   Name: [name] MRN: [id number]  Date of Service: 07/31/2022  DOB: [birth year]  Diagnosis:   Putative Stage IA1, cT1aN0M0 NSCLC of the RLL  Prior Radiation:     06/10/2019-06/17/2019 SBRT Treatment:  The RLL target was treated to 54 Gy in 3 fractions  NARRATIVE: [name] is a pleasant 88 y.o. gentleman with a history of a stage I non-small cell lung cancer who underwent lobectomy with Dr. Arlyce Dice in 2002 . Pathology is no longer available for review in the epic system. He apparently had a local recurrence which prompted re-excision of the chest wall requiring prostethetic rib placement. He was followed for many years in surveillance without recurrence.  He developed a new nodule in the right lower lobe in the summer 2020 and PET scan in August 2020 revealed a 1 cm nodule in the right lower lobe with an SUV of 3.2, a right upper lobe nodule measuring 6 mm with an SUV of 1.6, and a 7 mm nodule too low for PET detection.  Given his age and comorbidities he was not a candidate for surgical resection or biopsy and proceeded with treatment of a putative stage Ia non-small cell lung cancer of the right lower lobe in October 2020.  He tolerated treatment well.  He went for a posttreatment CT on 07/28/2019 that revealed stability in the right lung over the treatment site. There appeared to be a dictation error stating it was the RUL, however on further imaging review, it is in the RLL corresponding to the PET scan prior to his therapy see below. The other two lesions in the RUL were evaluated and the 9  mm lesion was stable, and the 6 mm lesion previously was measured at 25mm.    He has been followed in surveillance since this time with stigmata of atherosclerotic disease of the coronary and aortic vessels, emphysema, cystic changes of the adrenal glands. The patient has also had stable nodules in the RUL and left lung. He had a CT on 07/25/22 that showed stable changes consistent with his prior lobectomy and re-excision as well as his radiation. His RUL nodule remains stable at 7 mm, and the left nodule has resolved in the interval imaging. He's contacted by phone to review these results.   On review of systems, the patient reports he remains hard of hearing but is not having any new symptoms of concern. He specifically notes no concerns about his breathing but can get short of breath with exertion. He still enjoys going out and about for errands and spending time in his yard during warmer months. No other complaints are verbalized.    PAST MEDICAL HISTORY:  Past Medical History:  Diagnosis Date   Arthritis    Gastroesophageal reflux    Hypercholesteremia    Hypertension    Kidney stones    Lung cancer ([place]) 2002-2003   Melanoma ([place]) 2019   Bilateral arms   Obstructive sleep apnea     PAST SURGICAL HISTORY: Past Surgical History:  Procedure Laterality Date   CHEST WALL TUMOR EXCISION Left 2003  EXCISIONAL HEMORRHOIDECTOMY  2019   IR ANGIOGRAM SELECTIVE EACH ADDITIONAL VESSEL  12/15/2021   IR ANGIOGRAM VISCERAL SELECTIVE  12/15/2021   IR EMBO ART  VEN HEMORR LYMPH EXTRAV  INC GUIDE ROADMAPPING  12/15/2021   IR US GUIDE VASC ACCESS RIGHT  12/15/2021   LOBECTOMY Right 2002   Lower lobe    PAST SOCIAL HISTORY:  Social History   Socioeconomic History   Marital status: Married    Spouse name: Not on file   Number of children: Not on file   Years of education: Not on file   Highest education level: Not on file  Occupational History   Occupation: retired    Comment: previous  Furniture conservator/restorer  Tobacco Use   Smoking status: Former    Packs/day: 1.00    Types: Cigarettes   Smokeless tobacco: Never   Tobacco comments:    Smoked between [employer]  Substance and Sexual Activity   Alcohol use: Not Currently   Drug use: Not on file   Sexual activity: Not on file  Other Topics Concern   Not on file  Social History Narrative   Not on file   Social Determinants of Health   Financial Resource Strain: Not on file  Food Insecurity: Not on file  Transportation Needs: No Transportation Needs (05/22/2019)   PRAPARE - Hydrologist (Medical): No    Lack of Transportation (Non-Medical): No  Physical Activity: Not on file  Stress: Not on file  Social Connections: Not on file  Intimate Partner Violence: Not on file  The patient is married and lives in [place]. He still enjoys mowing his yard and being active in his church.  PAST FAMILY HISTORY: Family History  Problem Relation Age of Onset   Colon cancer Father    Lung cancer Sister    Breast cancer Daughter     MEDICATIONS  Current Outpatient Medications  Medication Sig Dispense Refill   aspirin EC 81 MG tablet Take 81 mg by mouth daily.     Glucosamine 500 MG CAPS Take 500 mg by mouth daily.     levothyroxine (SYNTHROID) 50 MCG tablet Take 75 mcg by mouth daily before breakfast. Taking 1 & 1/2 tablets daily     lovastatin (MEVACOR) 40 MG tablet Take 40 mg by mouth at bedtime.     Multiple Vitamin (MULTI-VITAMIN) tablet Take 1 tablet by mouth daily.     nitroGLYCERIN (NITROSTAT) 0.4 MG SL tablet Place 0.4 mg under the tongue every 5 (five) minutes as needed for chest pain.     Omega-3 Fatty Acids (FISH OIL) 1200 MG CPDR Take 1,200 mg by mouth daily.     pantoprazole (PROTONIX) 40 MG tablet Take 1 tablet (40 mg total) by mouth 2 (two) times daily.     polyethylene glycol (MIRALAX / GLYCOLAX) 17 g packet Take 17 g by mouth daily.     pregabalin (LYRICA) 75 MG capsule Take 75 mg by  mouth 2 (two) times daily.     spironolactone (ALDACTONE) 25 MG tablet Take 37.5 mg by mouth daily. Taking 1 & 1/2 tablet(s) daily     tamsulosin (FLOMAX) 0.4 MG CAPS capsule Take 0.8 mg by mouth daily.     No current facility-administered medications for this encounter.    ALLERGIES:  Allergies  Allergen Reactions   Iodinated Contrast Media Hives    PHYSICAL EXAM: Unable to assess due to encounter type.   IMPRESSION/PLAN: 1. Putative Stage IA1, cT1aN0M0 NSCLC of the  RLL. The patient's imaging was reviewed and he continues to be radiographically without evidence of disease. He desires continued surveillance as long as medically he is doing well enough to tolerate having scans, and if new disease was noted, receiving treatment for this. Again, we will plan for repeat CT imaging in 6 months per NCCN guidelines. And confirmed this plan again with the patient and his wife. 2.          LLL nodule. The LLL nodule continues to remain stable and we will follow this as expectantly. 3.         Remote history of Stage I, NSCLC of the RLL. As per #1. He remains NED. This will continue to be followed expectantly on subsequent surveillance scans. 4. Thyroid nodule. This has not been commented on since 2021. He will follow with his medical doctor and we will note changes on subsequent imaging if necessary.   This encounter was conducted via telephone.  The patient has provided two factor identification and has given verbal consent for this type of encounter and has been advised to only accept a meeting of this type in a secure network environment. The time spent during this encounter was 35 minutes including preparation, discussion, and coordination of the patient's care. The attendants for this meeting include  Hayden Pedro  and Silver Huguenin and his wife Trent Gabler.  During the encounter, Hayden Pedro were located at [facility] Radiation Oncology Department.   Silver Huguenin was located at home with his wife Jaquavius Hudler.    Carola Rhine, PAC

## 2022-07-31 NOTE — Progress Notes (Addendum)
Telephone nursing appointment for patient to receive most recent scan results from 07/25/22 per Shona Simpson PA-C. I verified patient's identity and began nursing interview. Patient reports doing well.   Meaningful use complete.   Patient aware of their 1:00pm-07/31/22 telephone appointment w/ Shona Simpson PA-C. I left my extension [phone] in case patient needs anything. Patient verbalized understanding. This concludes the nursing interview.   Patient contact [phone]     Leandra Kern, LPN

## 2022-08-08 DIAGNOSIS — G4733 Obstructive sleep apnea (adult) (pediatric): Secondary | ICD-10-CM | POA: Diagnosis not present

## 2022-08-08 DIAGNOSIS — J449 Chronic obstructive pulmonary disease, unspecified: Secondary | ICD-10-CM | POA: Diagnosis not present

## 2022-08-08 DIAGNOSIS — R918 Other nonspecific abnormal finding of lung field: Secondary | ICD-10-CM | POA: Diagnosis not present

## 2022-08-09 DIAGNOSIS — N401 Enlarged prostate with lower urinary tract symptoms: Secondary | ICD-10-CM | POA: Diagnosis not present

## 2022-08-09 DIAGNOSIS — R972 Elevated prostate specific antigen [PSA]: Secondary | ICD-10-CM | POA: Diagnosis not present

## 2022-08-09 DIAGNOSIS — N390 Urinary tract infection, site not specified: Secondary | ICD-10-CM | POA: Diagnosis not present

## 2022-08-10 DIAGNOSIS — F419 Anxiety disorder, unspecified: Secondary | ICD-10-CM | POA: Diagnosis not present

## 2022-08-11 DIAGNOSIS — E038 Other specified hypothyroidism: Secondary | ICD-10-CM | POA: Diagnosis not present

## 2022-08-11 DIAGNOSIS — E782 Mixed hyperlipidemia: Secondary | ICD-10-CM | POA: Diagnosis not present

## 2022-08-11 DIAGNOSIS — M150 Primary generalized (osteo)arthritis: Secondary | ICD-10-CM | POA: Diagnosis not present

## 2022-08-11 DIAGNOSIS — E559 Vitamin D deficiency, unspecified: Secondary | ICD-10-CM | POA: Diagnosis not present

## 2022-08-11 DIAGNOSIS — N400 Enlarged prostate without lower urinary tract symptoms: Secondary | ICD-10-CM | POA: Diagnosis not present

## 2022-08-11 DIAGNOSIS — I10 Essential (primary) hypertension: Secondary | ICD-10-CM | POA: Diagnosis not present

## 2022-08-11 DIAGNOSIS — D518 Other vitamin B12 deficiency anemias: Secondary | ICD-10-CM | POA: Diagnosis not present

## 2022-08-11 DIAGNOSIS — E1165 Type 2 diabetes mellitus with hyperglycemia: Secondary | ICD-10-CM | POA: Diagnosis not present

## 2022-08-11 DIAGNOSIS — D631 Anemia in chronic kidney disease: Secondary | ICD-10-CM | POA: Diagnosis not present

## 2022-08-13 DIAGNOSIS — I10 Essential (primary) hypertension: Secondary | ICD-10-CM | POA: Diagnosis not present

## 2022-09-04 DIAGNOSIS — E038 Other specified hypothyroidism: Secondary | ICD-10-CM | POA: Diagnosis not present

## 2022-09-04 DIAGNOSIS — E559 Vitamin D deficiency, unspecified: Secondary | ICD-10-CM | POA: Diagnosis not present

## 2022-09-04 DIAGNOSIS — E1165 Type 2 diabetes mellitus with hyperglycemia: Secondary | ICD-10-CM | POA: Diagnosis not present

## 2022-09-04 DIAGNOSIS — D631 Anemia in chronic kidney disease: Secondary | ICD-10-CM | POA: Diagnosis not present

## 2022-09-04 DIAGNOSIS — N400 Enlarged prostate without lower urinary tract symptoms: Secondary | ICD-10-CM | POA: Diagnosis not present

## 2022-09-04 DIAGNOSIS — D518 Other vitamin B12 deficiency anemias: Secondary | ICD-10-CM | POA: Diagnosis not present

## 2022-09-04 DIAGNOSIS — E782 Mixed hyperlipidemia: Secondary | ICD-10-CM | POA: Diagnosis not present

## 2022-09-04 DIAGNOSIS — I10 Essential (primary) hypertension: Secondary | ICD-10-CM | POA: Diagnosis not present

## 2022-09-04 DIAGNOSIS — M150 Primary generalized (osteo)arthritis: Secondary | ICD-10-CM | POA: Diagnosis not present

## 2022-09-12 DIAGNOSIS — L570 Actinic keratosis: Secondary | ICD-10-CM | POA: Diagnosis not present

## 2022-09-12 DIAGNOSIS — L820 Inflamed seborrheic keratosis: Secondary | ICD-10-CM | POA: Diagnosis not present

## 2022-09-12 DIAGNOSIS — Z85820 Personal history of malignant melanoma of skin: Secondary | ICD-10-CM | POA: Diagnosis not present

## 2022-09-13 DIAGNOSIS — I10 Essential (primary) hypertension: Secondary | ICD-10-CM | POA: Diagnosis not present

## 2022-09-20 DIAGNOSIS — E1122 Type 2 diabetes mellitus with diabetic chronic kidney disease: Secondary | ICD-10-CM | POA: Diagnosis not present

## 2022-09-20 DIAGNOSIS — N1832 Chronic kidney disease, stage 3b: Secondary | ICD-10-CM | POA: Diagnosis not present

## 2022-09-20 DIAGNOSIS — C3490 Malignant neoplasm of unspecified part of unspecified bronchus or lung: Secondary | ICD-10-CM | POA: Diagnosis not present

## 2022-09-20 DIAGNOSIS — N390 Urinary tract infection, site not specified: Secondary | ICD-10-CM | POA: Diagnosis not present

## 2022-09-20 DIAGNOSIS — I129 Hypertensive chronic kidney disease with stage 1 through stage 4 chronic kidney disease, or unspecified chronic kidney disease: Secondary | ICD-10-CM | POA: Diagnosis not present

## 2022-09-20 DIAGNOSIS — K922 Gastrointestinal hemorrhage, unspecified: Secondary | ICD-10-CM | POA: Diagnosis not present

## 2022-09-27 DIAGNOSIS — E038 Other specified hypothyroidism: Secondary | ICD-10-CM | POA: Diagnosis not present

## 2022-09-27 DIAGNOSIS — M150 Primary generalized (osteo)arthritis: Secondary | ICD-10-CM | POA: Diagnosis not present

## 2022-09-27 DIAGNOSIS — N400 Enlarged prostate without lower urinary tract symptoms: Secondary | ICD-10-CM | POA: Diagnosis not present

## 2022-09-27 DIAGNOSIS — E559 Vitamin D deficiency, unspecified: Secondary | ICD-10-CM | POA: Diagnosis not present

## 2022-09-27 DIAGNOSIS — E1165 Type 2 diabetes mellitus with hyperglycemia: Secondary | ICD-10-CM | POA: Diagnosis not present

## 2022-09-27 DIAGNOSIS — I10 Essential (primary) hypertension: Secondary | ICD-10-CM | POA: Diagnosis not present

## 2022-09-27 DIAGNOSIS — D631 Anemia in chronic kidney disease: Secondary | ICD-10-CM | POA: Diagnosis not present

## 2022-09-27 DIAGNOSIS — E782 Mixed hyperlipidemia: Secondary | ICD-10-CM | POA: Diagnosis not present

## 2022-09-27 DIAGNOSIS — D518 Other vitamin B12 deficiency anemias: Secondary | ICD-10-CM | POA: Diagnosis not present

## 2022-10-10 DIAGNOSIS — F419 Anxiety disorder, unspecified: Secondary | ICD-10-CM | POA: Diagnosis not present

## 2022-10-12 DIAGNOSIS — I10 Essential (primary) hypertension: Secondary | ICD-10-CM | POA: Diagnosis not present

## 2022-10-12 DIAGNOSIS — G4733 Obstructive sleep apnea (adult) (pediatric): Secondary | ICD-10-CM | POA: Diagnosis not present

## 2022-10-12 DIAGNOSIS — M150 Primary generalized (osteo)arthritis: Secondary | ICD-10-CM | POA: Diagnosis not present

## 2022-10-12 DIAGNOSIS — E1165 Type 2 diabetes mellitus with hyperglycemia: Secondary | ICD-10-CM | POA: Diagnosis not present

## 2022-10-12 DIAGNOSIS — E782 Mixed hyperlipidemia: Secondary | ICD-10-CM | POA: Diagnosis not present

## 2022-10-12 DIAGNOSIS — N1832 Chronic kidney disease, stage 3b: Secondary | ICD-10-CM | POA: Diagnosis not present

## 2022-10-12 DIAGNOSIS — D518 Other vitamin B12 deficiency anemias: Secondary | ICD-10-CM | POA: Diagnosis not present

## 2022-10-12 DIAGNOSIS — E038 Other specified hypothyroidism: Secondary | ICD-10-CM | POA: Diagnosis not present

## 2022-10-12 DIAGNOSIS — Z0000 Encounter for general adult medical examination without abnormal findings: Secondary | ICD-10-CM | POA: Diagnosis not present

## 2022-10-14 DIAGNOSIS — I10 Essential (primary) hypertension: Secondary | ICD-10-CM | POA: Diagnosis not present

## 2022-10-25 DIAGNOSIS — R051 Acute cough: Secondary | ICD-10-CM | POA: Diagnosis not present

## 2022-10-25 DIAGNOSIS — R509 Fever, unspecified: Secondary | ICD-10-CM | POA: Diagnosis not present

## 2022-10-30 DIAGNOSIS — M9905 Segmental and somatic dysfunction of pelvic region: Secondary | ICD-10-CM | POA: Diagnosis not present

## 2022-10-30 DIAGNOSIS — M9902 Segmental and somatic dysfunction of thoracic region: Secondary | ICD-10-CM | POA: Diagnosis not present

## 2022-10-30 DIAGNOSIS — M9904 Segmental and somatic dysfunction of sacral region: Secondary | ICD-10-CM | POA: Diagnosis not present

## 2022-10-30 DIAGNOSIS — M9903 Segmental and somatic dysfunction of lumbar region: Secondary | ICD-10-CM | POA: Diagnosis not present

## 2022-10-31 DIAGNOSIS — F419 Anxiety disorder, unspecified: Secondary | ICD-10-CM | POA: Diagnosis not present

## 2022-11-12 DIAGNOSIS — I10 Essential (primary) hypertension: Secondary | ICD-10-CM | POA: Diagnosis not present

## 2022-11-14 DIAGNOSIS — J449 Chronic obstructive pulmonary disease, unspecified: Secondary | ICD-10-CM | POA: Diagnosis not present

## 2022-11-14 DIAGNOSIS — R918 Other nonspecific abnormal finding of lung field: Secondary | ICD-10-CM | POA: Diagnosis not present

## 2022-11-14 DIAGNOSIS — G4733 Obstructive sleep apnea (adult) (pediatric): Secondary | ICD-10-CM | POA: Diagnosis not present

## 2022-11-14 DIAGNOSIS — M545 Low back pain, unspecified: Secondary | ICD-10-CM | POA: Diagnosis not present

## 2022-11-14 DIAGNOSIS — I10 Essential (primary) hypertension: Secondary | ICD-10-CM | POA: Diagnosis not present

## 2022-11-14 DIAGNOSIS — E782 Mixed hyperlipidemia: Secondary | ICD-10-CM | POA: Diagnosis not present

## 2022-11-14 DIAGNOSIS — E038 Other specified hypothyroidism: Secondary | ICD-10-CM | POA: Diagnosis not present

## 2022-11-14 DIAGNOSIS — M150 Primary generalized (osteo)arthritis: Secondary | ICD-10-CM | POA: Diagnosis not present

## 2022-11-14 DIAGNOSIS — E559 Vitamin D deficiency, unspecified: Secondary | ICD-10-CM | POA: Diagnosis not present

## 2022-11-14 DIAGNOSIS — N400 Enlarged prostate without lower urinary tract symptoms: Secondary | ICD-10-CM | POA: Diagnosis not present

## 2022-11-14 DIAGNOSIS — D518 Other vitamin B12 deficiency anemias: Secondary | ICD-10-CM | POA: Diagnosis not present

## 2022-11-14 DIAGNOSIS — I700 Atherosclerosis of aorta: Secondary | ICD-10-CM | POA: Diagnosis not present

## 2022-11-14 DIAGNOSIS — W1830XA Fall on same level, unspecified, initial encounter: Secondary | ICD-10-CM | POA: Diagnosis not present

## 2022-11-14 DIAGNOSIS — E1165 Type 2 diabetes mellitus with hyperglycemia: Secondary | ICD-10-CM | POA: Diagnosis not present

## 2022-11-22 DIAGNOSIS — H6123 Impacted cerumen, bilateral: Secondary | ICD-10-CM | POA: Diagnosis not present

## 2022-11-22 DIAGNOSIS — E782 Mixed hyperlipidemia: Secondary | ICD-10-CM | POA: Diagnosis not present

## 2022-11-22 DIAGNOSIS — M150 Primary generalized (osteo)arthritis: Secondary | ICD-10-CM | POA: Diagnosis not present

## 2022-11-22 DIAGNOSIS — I10 Essential (primary) hypertension: Secondary | ICD-10-CM | POA: Diagnosis not present

## 2022-11-22 DIAGNOSIS — E1165 Type 2 diabetes mellitus with hyperglycemia: Secondary | ICD-10-CM | POA: Diagnosis not present

## 2022-11-22 DIAGNOSIS — E038 Other specified hypothyroidism: Secondary | ICD-10-CM | POA: Diagnosis not present

## 2022-11-22 DIAGNOSIS — M810 Age-related osteoporosis without current pathological fracture: Secondary | ICD-10-CM | POA: Diagnosis not present

## 2022-11-22 DIAGNOSIS — J449 Chronic obstructive pulmonary disease, unspecified: Secondary | ICD-10-CM | POA: Diagnosis not present

## 2022-11-29 DIAGNOSIS — E038 Other specified hypothyroidism: Secondary | ICD-10-CM | POA: Diagnosis not present

## 2022-11-29 DIAGNOSIS — D518 Other vitamin B12 deficiency anemias: Secondary | ICD-10-CM | POA: Diagnosis not present

## 2022-11-29 DIAGNOSIS — I700 Atherosclerosis of aorta: Secondary | ICD-10-CM | POA: Diagnosis not present

## 2022-11-29 DIAGNOSIS — I10 Essential (primary) hypertension: Secondary | ICD-10-CM | POA: Diagnosis not present

## 2022-11-29 DIAGNOSIS — E559 Vitamin D deficiency, unspecified: Secondary | ICD-10-CM | POA: Diagnosis not present

## 2022-11-29 DIAGNOSIS — N400 Enlarged prostate without lower urinary tract symptoms: Secondary | ICD-10-CM | POA: Diagnosis not present

## 2022-11-29 DIAGNOSIS — E782 Mixed hyperlipidemia: Secondary | ICD-10-CM | POA: Diagnosis not present

## 2022-11-29 DIAGNOSIS — E1165 Type 2 diabetes mellitus with hyperglycemia: Secondary | ICD-10-CM | POA: Diagnosis not present

## 2022-11-29 DIAGNOSIS — M150 Primary generalized (osteo)arthritis: Secondary | ICD-10-CM | POA: Diagnosis not present

## 2022-12-11 DIAGNOSIS — F419 Anxiety disorder, unspecified: Secondary | ICD-10-CM | POA: Diagnosis not present

## 2023-01-10 DIAGNOSIS — N401 Enlarged prostate with lower urinary tract symptoms: Secondary | ICD-10-CM | POA: Diagnosis not present

## 2023-01-10 DIAGNOSIS — E1165 Type 2 diabetes mellitus with hyperglycemia: Secondary | ICD-10-CM | POA: Diagnosis not present

## 2023-01-10 DIAGNOSIS — I10 Essential (primary) hypertension: Secondary | ICD-10-CM | POA: Diagnosis not present

## 2023-01-10 DIAGNOSIS — Z125 Encounter for screening for malignant neoplasm of prostate: Secondary | ICD-10-CM | POA: Diagnosis not present

## 2023-01-10 DIAGNOSIS — K219 Gastro-esophageal reflux disease without esophagitis: Secondary | ICD-10-CM | POA: Diagnosis not present

## 2023-01-10 DIAGNOSIS — E559 Vitamin D deficiency, unspecified: Secondary | ICD-10-CM | POA: Diagnosis not present

## 2023-01-10 DIAGNOSIS — Z79899 Other long term (current) drug therapy: Secondary | ICD-10-CM | POA: Diagnosis not present

## 2023-01-10 DIAGNOSIS — M150 Primary generalized (osteo)arthritis: Secondary | ICD-10-CM | POA: Diagnosis not present

## 2023-01-10 DIAGNOSIS — E038 Other specified hypothyroidism: Secondary | ICD-10-CM | POA: Diagnosis not present

## 2023-01-10 DIAGNOSIS — E782 Mixed hyperlipidemia: Secondary | ICD-10-CM | POA: Diagnosis not present

## 2023-01-10 DIAGNOSIS — D518 Other vitamin B12 deficiency anemias: Secondary | ICD-10-CM | POA: Diagnosis not present

## 2023-01-12 DIAGNOSIS — I10 Essential (primary) hypertension: Secondary | ICD-10-CM | POA: Diagnosis not present

## 2023-01-15 DIAGNOSIS — F419 Anxiety disorder, unspecified: Secondary | ICD-10-CM | POA: Diagnosis not present

## 2023-01-16 DIAGNOSIS — E782 Mixed hyperlipidemia: Secondary | ICD-10-CM | POA: Diagnosis not present

## 2023-01-16 DIAGNOSIS — I700 Atherosclerosis of aorta: Secondary | ICD-10-CM | POA: Diagnosis not present

## 2023-01-16 DIAGNOSIS — D518 Other vitamin B12 deficiency anemias: Secondary | ICD-10-CM | POA: Diagnosis not present

## 2023-01-16 DIAGNOSIS — I10 Essential (primary) hypertension: Secondary | ICD-10-CM | POA: Diagnosis not present

## 2023-01-16 DIAGNOSIS — E559 Vitamin D deficiency, unspecified: Secondary | ICD-10-CM | POA: Diagnosis not present

## 2023-01-16 DIAGNOSIS — M150 Primary generalized (osteo)arthritis: Secondary | ICD-10-CM | POA: Diagnosis not present

## 2023-01-16 DIAGNOSIS — E1165 Type 2 diabetes mellitus with hyperglycemia: Secondary | ICD-10-CM | POA: Diagnosis not present

## 2023-01-16 DIAGNOSIS — N400 Enlarged prostate without lower urinary tract symptoms: Secondary | ICD-10-CM | POA: Diagnosis not present

## 2023-01-16 DIAGNOSIS — E038 Other specified hypothyroidism: Secondary | ICD-10-CM | POA: Diagnosis not present

## 2023-01-22 ENCOUNTER — Telehealth: Payer: Self-pay | Admitting: *Deleted

## 2023-01-22 NOTE — Telephone Encounter (Signed)
Called patient to inform of Ct for 01-30-23- arrival time- 1 pm @ [facility], no restrictions to test, patient to receive results from Laurence Aly on 02-05-23 @ 1 pm for results via telephone, lvm for a return call

## 2023-02-05 ENCOUNTER — Encounter: Payer: Self-pay | Admitting: Radiation Oncology

## 2023-02-05 ENCOUNTER — Ambulatory Visit
Admission: RE | Admit: 2023-02-05 | Discharge: 2023-02-05 | Disposition: A | Discharge status: Home / Self Care | Payer: Medicare HMO | Source: Ambulatory Visit | Attending: Radiation Oncology | Admitting: Radiation Oncology

## 2023-02-05 DIAGNOSIS — C3411 Malignant neoplasm of upper lobe, right bronchus or lung: Secondary | ICD-10-CM

## 2023-02-05 DIAGNOSIS — C3431 Malignant neoplasm of lower lobe, right bronchus or lung: Secondary | ICD-10-CM

## 2023-02-05 NOTE — Progress Notes (Signed)
Radiation Oncology         (336) [phone] ________________________________   Outpatient Follow Up - Conducted via telephone at patient request.  I spoke with the patient to conduct this visit via telephone. The patient was notified in advance and was offered an in person or telemedicine meeting to allow for face to face communication but instead preferred to proceed with a telephone visit.  Name: [name] MRN: [id number]  Date of Service: 02/05/2023  DOB: Mar 30, 1931  Diagnosis:   Putative Stage IA1, cT1aN0M0 NSCLC of the RLL  Prior Radiation:     06/10/2019-06/17/2019 SBRT Treatment:  The RLL target was treated to 54 Gy in 3 fractions  NARRATIVE: [name] is a pleasant 89 y.o. gentleman with a history of a stage I non-small cell lung cancer who underwent RLL lobectomy with Dr. Edwyna Shell in 2002 . Pathology is no longer available for review in the epic system. He apparently had a local recurrence which prompted re-excision of the chest wall requiring prostethetic rib placement. He was followed for many years in surveillance without recurrence.  He developed a new nodule in the right lower lobe in the summer 2020 and PET scan in August 2020 revealed a 1 cm nodule in the right lower lobe with an SUV of 3.2, a right upper lobe nodule measuring 6 mm with an SUV of 1.6, and a 7 mm nodule too low for PET detection.  Given his age and comorbidities he was not a candidate for surgical resection or biopsy and proceeded with treatment of a putative stage Ia non-small cell lung cancer of the right lower lobe in October 2020.  He tolerated treatment well.     He went for a posttreatment CT on 07/28/2019 that revealed stability in the right lung over the treatment site. There appeared to be a dictation error stating it was the RUL, however on further imaging review, it is in the RLL corresponding to the PET scan prior to his therapy see below. The other two lesions in the RUL were evaluated and the 9 mm  lesion was stable, and the 6 mm lesion previously was measured at 7mm.    A CT on 01/30/23 that showed stable changes consistent with his prior lobectomy surgery, and re-excision as well as his radiation. His RUL nodule remains stable at 8 mm, and the left nodule was no longer called, and had resolved in last December, 2023's scan. No new nodules were noted. He does have a new compression fracture with 40% height loss at T12 vertebral body, no pathologic changes were noted however. He is contacted by phone to review these results.   On review of systems, the patient reports he remains hard of hearing and his wife is helpful at giving some of his history as well. She reports he is not short of breath unless he is exerting himself. He is still able to go out for errands and mows his yard regularly. He does have some low back pain in the last few months and No other complaints are verbalized.    PAST MEDICAL HISTORY:  Past Medical History:  Diagnosis Date   Arthritis    Gastroesophageal reflux    Hypercholesteremia    Hypertension    Kidney stones    Lung cancer (HCC) 2002-2003   Melanoma (HCC) 2019   Bilateral arms   Obstructive sleep apnea     PAST SURGICAL HISTORY: Past Surgical History:  Procedure Laterality Date   CHEST WALL TUMOR  EXCISION Left 2003   EXCISIONAL HEMORRHOIDECTOMY  2019   IR ANGIOGRAM SELECTIVE EACH ADDITIONAL VESSEL  12/15/2021   IR ANGIOGRAM VISCERAL SELECTIVE  12/15/2021   IR EMBO ART  VEN HEMORR LYMPH EXTRAV  INC GUIDE ROADMAPPING  12/15/2021   IR US GUIDE VASC ACCESS RIGHT  12/15/2021   LOBECTOMY Right 2002   Lower lobe    PAST SOCIAL HISTORY:  Social History   Socioeconomic History   Marital status: Married    Spouse name: Not on file   Number of children: Not on file   Years of education: Not on file   Highest education level: Not on file  Occupational History   Occupation: retired    Comment: previous Chartered certified accountant  Tobacco Use   Smoking status: Former     Packs/day: 1    Types: Cigarettes   Smokeless tobacco: Never   Tobacco comments:    Smoked between [employer]  Substance and Sexual Activity   Alcohol use: Not Currently   Drug use: Not on file   Sexual activity: Not on file  Other Topics Concern   Not on file  Social History Narrative   Not on file   Social Determinants of Health   Financial Resource Strain: Not on file  Food Insecurity: No Food Insecurity (02/05/2023)   Hunger Vital Sign    Worried About Running Out of Food in the Last Year: Never true    Ran Out of Food in the Last Year: Never true  Transportation Needs: No Transportation Needs (02/05/2023)   PRAPARE - Administrator, Civil Service (Medical): No    Lack of Transportation (Non-Medical): No  Physical Activity: Not on file  Stress: Not on file  Social Connections: Not on file  Intimate Partner Violence: Not At Risk (02/05/2023)   Humiliation, Afraid, Rape, and Kick questionnaire    Fear of Current or Ex-Partner: No    Emotionally Abused: No    Physically Abused: No    Sexually Abused: No  The patient is married and lives in [place]. He still enjoys mowing his yard and being active in his church.  PAST FAMILY HISTORY: Family History  Problem Relation Age of Onset   Colon cancer Father    Lung cancer Sister    Breast cancer Daughter     MEDICATIONS  Current Outpatient Medications  Medication Sig Dispense Refill   aspirin EC 81 MG tablet Take 81 mg by mouth daily.     Glucosamine 500 MG CAPS Take 500 mg by mouth daily.     levothyroxine (SYNTHROID) 50 MCG tablet Take 75 mcg by mouth daily before breakfast. Taking 1 & 1/2 tablets daily     lovastatin (MEVACOR) 40 MG tablet Take 40 mg by mouth at bedtime.     Multiple Vitamin (MULTI-VITAMIN) tablet Take 1 tablet by mouth daily.     nitroGLYCERIN (NITROSTAT) 0.4 MG SL tablet Place 0.4 mg under the tongue every 5 (five) minutes as needed for chest pain.     Omega-3 Fatty Acids (FISH OIL)  1200 MG CPDR Take 1,200 mg by mouth daily.     pantoprazole (PROTONIX) 40 MG tablet Take 1 tablet (40 mg total) by mouth 2 (two) times daily.     polyethylene glycol (MIRALAX / GLYCOLAX) 17 g packet Take 17 g by mouth daily.     pregabalin (LYRICA) 75 MG capsule Take 75 mg by mouth 2 (two) times daily.     spironolactone (ALDACTONE) 25 MG tablet Take 37.5  mg by mouth daily. Taking 1 & 1/2 tablet(s) daily     tamsulosin (FLOMAX) 0.4 MG CAPS capsule Take 0.8 mg by mouth daily.     No current facility-administered medications for this encounter.    ALLERGIES:  Allergies  Allergen Reactions   Iodinated Contrast Media Hives    PHYSICAL EXAM: Unable to assess due to encounter type.   IMPRESSION/PLAN: 1. Putative Stage IA1, cT1aN0M0 NSCLC of the RLL. The patient continues to do well radiographically. Clinically he seems to remain active and desires aggressive surveillance as long as he's physically able to do so. We discussed CT imaging in 6 months per NCCN guidelines. And confirmed this plan again with the patient and his wife. 2.          LLL nodule. This has resolved, but we will closely follow any developing nodules on future scans. 3.         Remote history of Stage I, NSCLC of the RLL. As per #1. He remains NED. This will continue to be followed expectantly on subsequent surveillance scans. 4. Thyroid nodule. This has not been commented on since 2021. He will follow with his medical doctor as needed regarding this issue.  5. T12 Compression Fracture. We discussed this finding and as he is having symptoms in that region could consider evaluation with IR for consideration of kyphoplasty. He will consider this and let me know if he would like to discuss this further, but at this time seems content to follow this and his PCP is also aware of this.   This encounter was conducted via telephone.  The patient has provided two factor identification and has given verbal consent for this type of  encounter and has been advised to only accept a meeting of this type in a secure network environment. The time spent during this encounter was 35 minutes including preparation, discussion, and coordination of the patient's care. The attendants for this meeting include  Ronny Bacon  and Alanda Slim and his wife [name].  During the encounter, Ronny Bacon were located at [facility] Radiation Oncology Department.  Alanda Slim was located at home with his wife [name].    Osker Mason, PAC

## 2023-02-05 NOTE — Addendum Note (Signed)
Encounter addended by: Ronny Bacon, PA-C on: 02/05/2023 1:19 PM  Actions taken: Follow-up modified

## 2023-02-05 NOTE — Progress Notes (Signed)
Telephone nursing appointment for patient to review most recent scan results from 01/30/2023. I verified patient's identity x2 and began nursing interview. Patient reports no issues at this time.   Meaningful use complete.   Patient aware of their 1:00pm-02/05/2023 telephone appointment w/ Laurence Aly PA-C. I left my extension [phone] in case patient needs anything. Patient verbalized understanding. This concludes the nursing interview.   Patient contact [phone]     Ruel Favors, LPN

## 2023-08-01 ENCOUNTER — Telehealth: Payer: Self-pay | Admitting: *Deleted

## 2023-08-01 NOTE — Telephone Encounter (Signed)
Called patient to inform of CT for 08-07-23- arrival time- 12:30 pm @ [facility] Radiology, no restrictions to scan, patient to receive results from Laurence Aly 08-13-23 @ 1 pm , via telephone, lvm for a return call

## 2023-08-13 ENCOUNTER — Ambulatory Visit
Admission: RE | Admit: 2023-08-13 | Discharge: 2023-08-13 | Disposition: A | Discharge status: Home / Self Care | Payer: Medicare HMO | Source: Ambulatory Visit | Attending: Radiation Oncology | Admitting: Radiation Oncology

## 2023-08-13 DIAGNOSIS — C3431 Malignant neoplasm of lower lobe, right bronchus or lung: Secondary | ICD-10-CM

## 2023-08-13 NOTE — Progress Notes (Signed)
Radiation Oncology         [phone] ________________________________   Outpatient Follow Up - Conducted via telephone at patient request.   Name: [name] MRN: [id number]  Date of Service: 08/13/2023  DOB: 1931/03/13  Diagnosis:   Putative Stage IA1, cT1aN0M0 NSCLC of the RLL  Prior Radiation:     06/10/2019-06/17/2019 SBRT Treatment:  The RLL target was treated to 54 Gy in 3 fractions  NARRATIVE: [name] is a pleasant 89 y.o. gentleman with a history of a stage I non-small cell lung cancer who underwent RLL lobectomy with Dr. Edwyna Shell in 2002 . Pathology is no longer available for review in the epic system. He apparently had a local recurrence which prompted re-excision of the chest wall requiring prostethetic rib placement. He was followed for many years in surveillance without recurrence.  He developed a new nodule in the right lower lobe in the summer 2020 and PET scan in August 2020 revealed a 1 cm nodule in the right lower lobe with an SUV of 3.2, a right upper lobe nodule measuring 6 mm with an SUV of 1.6, and a 7 mm nodule too low for PET detection.  Given his age and comorbidities he was not a candidate for surgical resection or biopsy and proceeded with treatment of a putative stage Ia non-small cell lung cancer of the right lower lobe in October 2020.  He tolerated treatment well.     He went for a posttreatment CT on 07/28/2019 that revealed stability in the right lung over the treatment site. There appeared to be a dictation error stating it was the RUL, however on further imaging review, it is in the RLL corresponding to the PET scan prior to his therapy see below. The other two lesions in the RUL were evaluated and the 9 mm lesion was stable, and the 6 mm lesion previously was measured at 7mm.    Scans in June 2024 showed stability in his treated lung target and a new compression fracture with 40% height loss at T12 vertebral body, no pathologic changes were noted  and he was offered evaluation with IR for kyphoplasty but was not interested in further evaluation at that time. He had a repeat CT chest on 08/07/23 that showed stable changes in the RLL from prior radiation, as well as progressive compression fracture to a 50% height loss compared to previous, and a stable 1.3 cm ground glass change in the RUL which as been stable for a year. He's contacted today to review these results.    On review of systems, the patient did not answer my call when I contacted him.  Nursing has also had a hard time trying to reach him today.     PAST MEDICAL HISTORY:  Past Medical History:  Diagnosis Date   Arthritis    Gastroesophageal reflux    Hypercholesteremia    Hypertension    Kidney stones    Lung cancer (HCC) 2002-2003   Melanoma (HCC) 2019   Bilateral arms   Obstructive sleep apnea     PAST SURGICAL HISTORY: Past Surgical History:  Procedure Laterality Date   CHEST WALL TUMOR EXCISION Left 2003   EXCISIONAL HEMORRHOIDECTOMY  2019   IR ANGIOGRAM SELECTIVE EACH ADDITIONAL VESSEL  12/15/2021   IR ANGIOGRAM VISCERAL SELECTIVE  12/15/2021   IR EMBO ART  VEN HEMORR LYMPH EXTRAV  INC GUIDE ROADMAPPING  12/15/2021   IR US GUIDE VASC ACCESS RIGHT  12/15/2021   LOBECTOMY Right  2002   Lower lobe    PAST SOCIAL HISTORY:  Social History   Socioeconomic History   Marital status: Married    Spouse name: Not on file   Number of children: Not on file   Years of education: Not on file   Highest education level: Not on file  Occupational History   Occupation: retired    Comment: previous Chartered certified accountant  Tobacco Use   Smoking status: Former    Current packs/day: 1.00    Types: Cigarettes   Smokeless tobacco: Never   Tobacco comments:    Smoked between [employer]  Substance and Sexual Activity   Alcohol use: Not Currently   Drug use: Not on file   Sexual activity: Not on file  Other Topics Concern   Not on file  Social History Narrative   Not on file    Social Drivers of Health   Financial Resource Strain: Not on file  Food Insecurity: No Food Insecurity (02/05/2023)   Hunger Vital Sign    Worried About Running Out of Food in the Last Year: Never true    Ran Out of Food in the Last Year: Never true  Transportation Needs: No Transportation Needs (02/05/2023)   PRAPARE - Administrator, Civil Service (Medical): No    Lack of Transportation (Non-Medical): No  Physical Activity: Not on file  Stress: Not on file  Social Connections: Not on file  Intimate Partner Violence: Not At Risk (02/05/2023)   Humiliation, Afraid, Rape, and Kick questionnaire    Fear of Current or Ex-Partner: No    Emotionally Abused: No    Physically Abused: No    Sexually Abused: No  The patient is married and lives in [place]. He still enjoys mowing his yard and being active in his church.  PAST FAMILY HISTORY: Family History  Problem Relation Age of Onset   Colon cancer Father    Lung cancer Sister    Breast cancer Daughter     MEDICATIONS  Current Outpatient Medications  Medication Sig Dispense Refill   aspirin EC 81 MG tablet Take 81 mg by mouth daily.     Glucosamine 500 MG CAPS Take 500 mg by mouth daily.     levothyroxine (SYNTHROID) 50 MCG tablet Take 75 mcg by mouth daily before breakfast. Taking 1 & 1/2 tablets daily     lovastatin (MEVACOR) 40 MG tablet Take 40 mg by mouth at bedtime.     Multiple Vitamin (MULTI-VITAMIN) tablet Take 1 tablet by mouth daily.     nitroGLYCERIN (NITROSTAT) 0.4 MG SL tablet Place 0.4 mg under the tongue every 5 (five) minutes as needed for chest pain.     Omega-3 Fatty Acids (FISH OIL) 1200 MG CPDR Take 1,200 mg by mouth daily.     pantoprazole (PROTONIX) 40 MG tablet Take 1 tablet (40 mg total) by mouth 2 (two) times daily.     polyethylene glycol (MIRALAX / GLYCOLAX) 17 g packet Take 17 g by mouth daily.     pregabalin (LYRICA) 75 MG capsule Take 75 mg by mouth 2 (two) times daily.      spironolactone (ALDACTONE) 25 MG tablet Take 37.5 mg by mouth daily. Taking 1 & 1/2 tablet(s) daily     tamsulosin (FLOMAX) 0.4 MG CAPS capsule Take 0.8 mg by mouth daily.     No current facility-administered medications for this encounter.    ALLERGIES:  Allergies  Allergen Reactions   Iodinated Contrast Media Hives    PHYSICAL  EXAM: Unable to assess due to encounter type.   IMPRESSION/PLAN: 1. Putative Stage IA1, cT1aN0M0 NSCLC of the RLL.  I was not able to reach the patient today by phone but left a voicemail indicating the reassuring findings in the right lower lobe where previous radiation has been given, I would recommend a repeat scan in 6 months time.  The patient was given our contact number should he call back to discuss this in more detail.  I will go ahead and order his next scan and send messages to our scheduling team. 2.          LLL nodule. This has resolved, but we will closely follow any developing nodules on future scans. 3.         Remote history of Stage I, NSCLC of the RLL. As per #1. He remains NED. This will continue to be followed expectantly on subsequent surveillance scans. 4. Thyroid nodule. This has not been commented on since 2021. He will follow with his medical doctor as needed regarding this issue.  5. T12 Compression Fracture.  Again on the voicemail I left, we would continue to follow this area and if he is having symptoms should  consider being evaluated by radiology for kyphoplasty.  Again this was to have been a telephone encounter, a voicemail was left for the patient but the encounter not billed for given the inability to connect with the patient or his wife.   Osker Mason, PAC

## 2023-08-13 NOTE — Progress Notes (Signed)
Several attempts made to reach patient for today's 1pm telephone apt to review most recent CT-results w/ James Aly PA-C. I left a voicemail to return my call. We will cancel today's apt.    Ruel Favors, LPN

## 2024-02-05 ENCOUNTER — Telehealth: Payer: Self-pay | Admitting: *Deleted

## 2024-02-05 NOTE — Telephone Encounter (Signed)
 Called patient to inform of CT for 02-11-24- arrival time - 10:30 am @ [facility] Radiology, no restrictions to scan, and patient to receive results from Allana Ishikawa via telephone on 02-25-24 @ 1 pm, spoke with patient and he is aware of these appts. and the instructions

## 2024-02-12 ENCOUNTER — Telehealth: Payer: Self-pay | Admitting: Radiation Oncology

## 2024-02-12 DIAGNOSIS — C3431 Malignant neoplasm of lower lobe, right bronchus or lung: Secondary | ICD-10-CM

## 2024-02-12 NOTE — Progress Notes (Signed)
 Canceled.

## 2024-02-12 NOTE — Telephone Encounter (Signed)
 I called the patient to review his CT scan results and we discussed repeating in 6 months time, then annually thereafter if stable.

## 2024-02-25 ENCOUNTER — Inpatient Hospital Stay
Admission: RE | Admit: 2024-02-25 | Discharge: 2024-02-25 | Disposition: A | Discharge status: Home / Self Care | Payer: Medicare HMO | Source: Ambulatory Visit | Attending: Radiation Oncology | Admitting: Radiation Oncology

## 2024-07-16 ENCOUNTER — Telehealth: Payer: Self-pay | Admitting: *Deleted

## 2024-07-16 NOTE — Telephone Encounter (Signed)
 Called patient to inform of Ct for 07-22-24- arrival time- 2:15 pm @ [facility] Radiology, no restrictions to scan, patient to be called by Donald Husband on 08-04-24 @ 2 pm with results, lvm for a return call

## 2024-07-29 ENCOUNTER — Telehealth: Payer: Self-pay | Admitting: Radiation Oncology

## 2024-07-29 DIAGNOSIS — C3431 Malignant neoplasm of lower lobe, right bronchus or lung: Secondary | ICD-10-CM

## 2024-07-29 DIAGNOSIS — C3411 Malignant neoplasm of upper lobe, right bronchus or lung: Secondary | ICD-10-CM

## 2024-07-29 NOTE — Telephone Encounter (Signed)
 The pt called wanting to discuss his CT scan and we were able to review the reassuring results. At his request we will cancel the appt we had for 12/15 to review since we're talking today. I recommended moving to annual scans and he is in agreement.

## 2024-08-04 ENCOUNTER — Ambulatory Visit: Admitting: Radiation Oncology

## 2025-08-03 ENCOUNTER — Ambulatory Visit: Admitting: Radiation Oncology
